# Patient Record
Sex: Female | Born: 1940 | Race: White | Hispanic: No | Marital: Married | Smoking: Never smoker
Health system: Southern US, Community
[De-identification: ages and names within clinical notes are randomized; demographics above are authoritative.]

## PROBLEM LIST (undated history)

## (undated) DIAGNOSIS — I10 Essential (primary) hypertension: Secondary | ICD-10-CM

## (undated) DIAGNOSIS — C50919 Malignant neoplasm of unspecified site of unspecified female breast: Secondary | ICD-10-CM

## (undated) DIAGNOSIS — K219 Gastro-esophageal reflux disease without esophagitis: Secondary | ICD-10-CM

## (undated) DIAGNOSIS — I2119 ST elevation (STEMI) myocardial infarction involving other coronary artery of inferior wall: Principal | ICD-10-CM

## (undated) DIAGNOSIS — K5010 Crohn's disease of large intestine without complications: Secondary | ICD-10-CM

## (undated) DIAGNOSIS — Z8619 Personal history of other infectious and parasitic diseases: Secondary | ICD-10-CM

## (undated) DIAGNOSIS — Z9289 Personal history of other medical treatment: Secondary | ICD-10-CM

## (undated) DIAGNOSIS — K900 Celiac disease: Secondary | ICD-10-CM

## (undated) DIAGNOSIS — Z86718 Personal history of other venous thrombosis and embolism: Secondary | ICD-10-CM

## (undated) HISTORY — DX: Personal history of other medical treatment: Z92.89

## (undated) HISTORY — PX: DILATION AND CURETTAGE OF UTERUS: SHX78

## (undated) HISTORY — DX: Malignant neoplasm of unspecified site of unspecified female breast: C50.919

## (undated) HISTORY — DX: Gastro-esophageal reflux disease without esophagitis: K21.9

## (undated) HISTORY — DX: Personal history of other venous thrombosis and embolism: Z86.718

## (undated) HISTORY — DX: Essential (primary) hypertension: I10

## (undated) HISTORY — DX: Personal history of other infectious and parasitic diseases: Z86.19

---

## 1985-04-04 HISTORY — PX: BREAST SURGERY: SHX581

## 1997-07-22 ENCOUNTER — Ambulatory Visit (HOSPITAL_COMMUNITY): Admission: RE | Admit: 1997-07-22 | Discharge: 1997-07-22 | Payer: Self-pay | Admitting: *Deleted

## 1997-07-24 ENCOUNTER — Ambulatory Visit (HOSPITAL_COMMUNITY): Admission: RE | Admit: 1997-07-24 | Discharge: 1997-07-24 | Payer: Self-pay | Admitting: *Deleted

## 1997-07-24 ENCOUNTER — Encounter: Admission: RE | Admit: 1997-07-24 | Discharge: 1997-10-22 | Payer: Self-pay | Admitting: *Deleted

## 1998-01-21 ENCOUNTER — Ambulatory Visit (HOSPITAL_COMMUNITY): Admission: RE | Admit: 1998-01-21 | Discharge: 1998-01-21 | Payer: Self-pay

## 1998-12-09 ENCOUNTER — Encounter: Payer: Self-pay | Admitting: *Deleted

## 1998-12-09 ENCOUNTER — Ambulatory Visit (HOSPITAL_COMMUNITY): Admission: RE | Admit: 1998-12-09 | Discharge: 1998-12-09 | Payer: Self-pay | Admitting: *Deleted

## 1998-12-17 ENCOUNTER — Observation Stay (HOSPITAL_COMMUNITY): Admission: RE | Admit: 1998-12-17 | Discharge: 1998-12-18 | Payer: Self-pay | Admitting: General Surgery

## 1998-12-17 ENCOUNTER — Encounter (INDEPENDENT_AMBULATORY_CARE_PROVIDER_SITE_OTHER): Payer: Self-pay | Admitting: Specialist

## 1998-12-17 ENCOUNTER — Encounter: Payer: Self-pay | Admitting: General Surgery

## 2000-04-04 LAB — HM PAP SMEAR

## 2003-08-12 ENCOUNTER — Emergency Department (HOSPITAL_COMMUNITY): Admission: EM | Admit: 2003-08-12 | Discharge: 2003-08-12 | Payer: Self-pay | Admitting: Family Medicine

## 2003-09-16 ENCOUNTER — Encounter: Admission: RE | Admit: 2003-09-16 | Discharge: 2003-09-16 | Payer: Self-pay | Admitting: Family Medicine

## 2003-10-07 ENCOUNTER — Encounter: Admission: RE | Admit: 2003-10-07 | Discharge: 2003-10-07 | Payer: Self-pay | Admitting: Sports Medicine

## 2003-10-14 ENCOUNTER — Encounter: Admission: RE | Admit: 2003-10-14 | Discharge: 2003-10-14 | Payer: Self-pay | Admitting: Family Medicine

## 2003-11-12 ENCOUNTER — Encounter: Admission: RE | Admit: 2003-11-12 | Discharge: 2003-11-12 | Payer: Self-pay | Admitting: Sports Medicine

## 2003-12-30 ENCOUNTER — Ambulatory Visit: Payer: Self-pay | Admitting: Sports Medicine

## 2004-01-07 ENCOUNTER — Ambulatory Visit: Payer: Self-pay | Admitting: Sports Medicine

## 2004-01-19 ENCOUNTER — Ambulatory Visit (HOSPITAL_COMMUNITY): Admission: RE | Admit: 2004-01-19 | Discharge: 2004-01-19 | Payer: Self-pay | Admitting: Sports Medicine

## 2004-02-18 ENCOUNTER — Ambulatory Visit: Payer: Self-pay | Admitting: Sports Medicine

## 2004-02-25 ENCOUNTER — Ambulatory Visit: Payer: Self-pay | Admitting: Family Medicine

## 2004-04-02 ENCOUNTER — Ambulatory Visit: Payer: Self-pay | Admitting: Family Medicine

## 2004-04-07 ENCOUNTER — Ambulatory Visit: Payer: Self-pay | Admitting: Family Medicine

## 2004-04-14 ENCOUNTER — Ambulatory Visit: Payer: Self-pay | Admitting: Sports Medicine

## 2004-06-01 ENCOUNTER — Ambulatory Visit: Payer: Self-pay | Admitting: Sports Medicine

## 2004-06-22 ENCOUNTER — Ambulatory Visit (HOSPITAL_COMMUNITY): Admission: RE | Admit: 2004-06-22 | Discharge: 2004-06-22 | Payer: Self-pay | Admitting: Gastroenterology

## 2004-06-22 ENCOUNTER — Encounter (INDEPENDENT_AMBULATORY_CARE_PROVIDER_SITE_OTHER): Payer: Self-pay | Admitting: *Deleted

## 2004-09-14 ENCOUNTER — Ambulatory Visit: Payer: Self-pay | Admitting: Sports Medicine

## 2004-09-20 ENCOUNTER — Ambulatory Visit: Payer: Self-pay | Admitting: Family Medicine

## 2004-11-02 ENCOUNTER — Encounter (INDEPENDENT_AMBULATORY_CARE_PROVIDER_SITE_OTHER): Payer: Self-pay | Admitting: *Deleted

## 2004-12-10 ENCOUNTER — Ambulatory Visit: Payer: Self-pay | Admitting: Family Medicine

## 2004-12-13 ENCOUNTER — Ambulatory Visit (HOSPITAL_COMMUNITY): Admission: RE | Admit: 2004-12-13 | Discharge: 2004-12-13 | Payer: Self-pay | Admitting: Family Medicine

## 2004-12-21 ENCOUNTER — Ambulatory Visit: Payer: Self-pay | Admitting: Sports Medicine

## 2005-01-17 ENCOUNTER — Ambulatory Visit: Payer: Self-pay | Admitting: Family Medicine

## 2005-02-17 ENCOUNTER — Ambulatory Visit (HOSPITAL_COMMUNITY): Admission: RE | Admit: 2005-02-17 | Discharge: 2005-02-17 | Payer: Self-pay | Admitting: Sports Medicine

## 2005-03-10 ENCOUNTER — Ambulatory Visit: Payer: Self-pay | Admitting: Family Medicine

## 2005-03-16 ENCOUNTER — Ambulatory Visit: Payer: Self-pay | Admitting: Family Medicine

## 2005-04-04 HISTORY — PX: CHOLECYSTECTOMY: SHX55

## 2005-04-14 ENCOUNTER — Encounter (INDEPENDENT_AMBULATORY_CARE_PROVIDER_SITE_OTHER): Payer: Self-pay | Admitting: Specialist

## 2005-04-14 ENCOUNTER — Ambulatory Visit (HOSPITAL_COMMUNITY): Admission: RE | Admit: 2005-04-14 | Discharge: 2005-04-14 | Payer: Self-pay | Admitting: Obstetrics and Gynecology

## 2005-06-22 ENCOUNTER — Ambulatory Visit: Payer: Self-pay | Admitting: Family Medicine

## 2005-06-28 ENCOUNTER — Ambulatory Visit: Payer: Self-pay | Admitting: Sports Medicine

## 2005-08-03 ENCOUNTER — Ambulatory Visit (HOSPITAL_COMMUNITY): Admission: RE | Admit: 2005-08-03 | Discharge: 2005-08-03 | Payer: Self-pay | Admitting: Sports Medicine

## 2005-12-19 ENCOUNTER — Ambulatory Visit: Payer: Self-pay | Admitting: Family Medicine

## 2006-06-02 ENCOUNTER — Encounter (INDEPENDENT_AMBULATORY_CARE_PROVIDER_SITE_OTHER): Payer: Self-pay | Admitting: *Deleted

## 2006-09-27 ENCOUNTER — Telehealth: Payer: Self-pay | Admitting: Family Medicine

## 2006-12-26 ENCOUNTER — Ambulatory Visit (HOSPITAL_COMMUNITY): Admission: RE | Admit: 2006-12-26 | Discharge: 2006-12-26 | Payer: Self-pay | Admitting: Internal Medicine

## 2007-01-01 DIAGNOSIS — Z853 Personal history of malignant neoplasm of breast: Secondary | ICD-10-CM

## 2007-01-01 DIAGNOSIS — I10 Essential (primary) hypertension: Secondary | ICD-10-CM | POA: Insufficient documentation

## 2007-12-27 ENCOUNTER — Encounter: Admission: RE | Admit: 2007-12-27 | Discharge: 2007-12-27 | Payer: Self-pay | Admitting: Internal Medicine

## 2009-04-14 ENCOUNTER — Ambulatory Visit: Payer: Self-pay | Admitting: Oncology

## 2009-04-22 ENCOUNTER — Encounter: Admission: RE | Admit: 2009-04-22 | Discharge: 2009-04-22 | Payer: Self-pay | Admitting: Internal Medicine

## 2010-04-04 LAB — HM MAMMOGRAPHY

## 2010-08-20 NOTE — Op Note (Signed)
Kellie Bennett, Kellie Bennett                ACCOUNT NO.:  [id number]   MEDICAL RECORD NO.:  [id number]          PATIENT TYPE:  AMB   LOCATION:  ENDO                         FACILITY:  [facility]   PHYSICIAN:  John C. Madilyn Fireman, M.D.    DATE OF BIRTH:  03-27-1941   DATE OF PROCEDURE:  06/22/2004  DATE OF DISCHARGE:                                 OPERATIVE REPORT   PROCEDURE:  Colonoscopy.   INDICATIONS FOR PROCEDURE:  Intermittent rectal bleeding, a personal history  of breast cancer.   PROCEDURE:  The patient was placed in the left lateral decubitus position  and placed on the pulse monitor with continuous low flow oxygen delivered by  nasal cannula.  She was sedated with 50 mcg IV fentanyl and 6 mg IV Versed.  The Olympus video colonoscope was inserted into the rectum and advanced to  the cecum, confirmed by transillumination at McBurney's point and  visualization of the ileocecal valve and appendiceal orifice.  Prep was  excellent.  The cecum, ascending, transverse, and descending colon appeared  normal with no masses, polyps, diverticula, or other mucosal abnormalities.  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  The  distal 5 cm appeared to be spared with normal-appearing mucosa.  There are  no polyps, pseudopolyps, or ulcers seen.  Biopsies were taken of the  inflamed areas.  The scope was then withdrawn, and the patient returned to  the recovery room in stable condition.  She tolerated the procedure well,  and there were no immediate complications.   IMPRESSION:  Colitis involving the sigmoid and majority of the rectum,  sparing the distal 5 cm.   PLAN:  Await histology, and given that she is already on Azulfindine, will  consider topical therapy with five ASA enemas.      JCH/MEDQ  D:  06/22/2004  T:  06/22/2004   Job:  [id number]   cc:   Melina Fiddler, MD  [address] [place]  [place] [address]  Fax: [phone]

## 2010-08-20 NOTE — Op Note (Signed)
Kellie Bennett, Kellie Bennett                ACCOUNT NO.:  [id number]   MEDICAL RECORD NO.:  [id number]          PATIENT TYPE:  AMB   LOCATION:  SDC                           FACILITY:  [facility]   PHYSICIAN:  Malachi Pro. Ambrose Mantle, M.D. DATE OF BIRTH:  08-30-1940   DATE OF PROCEDURE:  04/14/2005  DATE OF DISCHARGE:                                 OPERATIVE REPORT   PREOPERATIVE DIAGNOSES:  1.  Postmenopausal bleeding.  2.  Probable endometrial polyp.   POSTOPERATIVE DIAGNOSES:  1.  Postmenopausal bleeding.  2.  Large endometrial polyp.   OPERATION:  Dilatation and curettage, hysteroscopy, removal of large  endometrial polyp.   OPERATOR:  Malachi Pro. Ambrose Mantle, M.D.   General anesthesia.   The patient was brought to the operating room and placed under satisfactory  general anesthesia and placed in lithotomy position.  The patient was  incontinent of some stool during the prep.  The vulva and vagina were  prepped with Betadine solution and the area was draped as a sterile field.  Exam revealed the uterus to be anterior and normal size.  The adnexa were  free of masses.  The cervix was drawn into the operative field with a  weighted speculum in the posterior vagina and the uterus sounded to 7+  centimeters anteriorly.  It was dilated to a 27 Pratt dilator.  A  hysteroscope was introduced and you could see an endometrial polyp coming  all the way down to the endocervical canal.  I could not tell exactly where  on the endometrial cavity it arose, but I removed the hysteroscope and began  using polyp forceps, then a uterine dressing forceps and finally a small  sponge forceps to remove the large fragments of this big endometrial polyp.  After I had removed all the tissue I could, I went back with the  hysteroscope and saw the cavity was smooth.  There was a small amount of  tissue left.  I tried to remove additional tissue and then looked again with  the hysteroscope and realized that I had removed all the  tissue that could.  I then did an endocervical curettage and finally an endometrial curettage.  There may be no tissue with either specimen.  Whatever was removed was  preserved and sent to pathology.  The procedure was then terminated.  There  was a sorbitol deficit of 80 mL.  The patient then returned to recovery in  satisfactory condition.      Malachi Pro. Ambrose Mantle, M.D.  Electronically Signed    TFH/MEDQ  D:  04/14/2005  T:  04/14/2005  Job:  [id number]   cc:   William A. Leveda Anna, M.D.  Fax: [phone]

## 2011-08-12 ENCOUNTER — Other Ambulatory Visit: Payer: Self-pay | Admitting: Internal Medicine

## 2011-08-12 DIAGNOSIS — E041 Nontoxic single thyroid nodule: Secondary | ICD-10-CM

## 2011-08-24 ENCOUNTER — Ambulatory Visit
Admission: RE | Admit: 2011-08-24 | Discharge: 2011-08-24 | Disposition: A | Discharge status: Home / Self Care | Payer: Medicare Other | Source: Ambulatory Visit | Attending: Internal Medicine | Admitting: Internal Medicine

## 2011-08-24 DIAGNOSIS — E041 Nontoxic single thyroid nodule: Secondary | ICD-10-CM

## 2011-10-27 ENCOUNTER — Ambulatory Visit (INDEPENDENT_AMBULATORY_CARE_PROVIDER_SITE_OTHER): Payer: Medicare Other | Admitting: Endocrinology

## 2011-10-27 ENCOUNTER — Other Ambulatory Visit (HOSPITAL_COMMUNITY)
Admission: RE | Admit: 2011-10-27 | Discharge: 2011-10-27 | Disposition: A | Discharge status: Home / Self Care | Payer: Medicare Other | Source: Ambulatory Visit | Attending: Endocrinology | Admitting: Endocrinology

## 2011-10-27 ENCOUNTER — Encounter: Payer: Self-pay | Admitting: Endocrinology

## 2011-10-27 VITALS — BP 128/88 | HR 69 | Temp 97.8°F | Ht 61.0 in | Wt 180.0 lb

## 2011-10-27 DIAGNOSIS — E042 Nontoxic multinodular goiter: Secondary | ICD-10-CM | POA: Insufficient documentation

## 2011-10-27 DIAGNOSIS — E039 Hypothyroidism, unspecified: Secondary | ICD-10-CM

## 2011-10-27 DIAGNOSIS — E041 Nontoxic single thyroid nodule: Secondary | ICD-10-CM | POA: Insufficient documentation

## 2011-10-27 MED ORDER — LEVOTHYROXINE SODIUM 50 MCG PO TABS: 50.0000 ug | ORAL_TABLET | Freq: Every day | ORAL | Status: AC

## 2011-10-27 NOTE — Patient Instructions (Addendum)
We'll call you with biopsy results If no cancer is seen, Please come back for a follow-up appointment in 6 months Increase levothyroxine to 50 mcg/day.  i have sent a prescription to your pharmacy. blood tests are being requested for you today, to do in 1 month.  You will receive a letter with results.

## 2011-10-27 NOTE — Progress Notes (Signed)
Subjective:    Patient ID: Kellie Bennett, female    DOB: 02/16/1941, 71 y.o.   MRN: [id number]  HPI Pt says she was rx'ed for hypothyroidism for a few years in the 1970's.  Since then, she says she was told that the thyroid was back to normal, off any supplement.  She was recently restarted on synthroid, after a tsh was elevated. She was also noted on physical exam to have a goiter.   Pt reports few years of moderate hair loss throughout the head, and assoc cold intolerance.   Past Medical History  Diagnosis Date  . Breast cancer   . History of chicken pox   . GERD (gastroesophageal reflux disease)   . Hypertension   . History of blood clots   . History of blood transfusion     Past Surgical History  Procedure Date  . Cholecystectomy 2007  . Breast surgery 1987    Breast Biopsy     History   Social History  . Marital Status: Married    Spouse Name: N/A    Number of Children: 6  . Years of Education: 12   Occupational History  . Retired    Social History Main Topics  . Smoking status: Never Smoker   . Smokeless tobacco: Not on file  . Alcohol Use: Yes  . Drug Use: No  . Sexually Active: Not on file   Other Topics Concern  . Not on file   Social History Narrative   Regular exercise-noCaffeine Use-yes    Current Outpatient Prescriptions on File Prior to Visit  Medication Sig Dispense Refill  . levothyroxine (SYNTHROID, LEVOTHROID) 25 MCG tablet Take 25 mcg by mouth daily.      . metoprolol succinate (TOPROL-XL) 25 MG 24 hr tablet Take 25 mg by mouth 2 (two) times daily.      Marland Kitchen omeprazole (PRILOSEC) 20 MG capsule Take 20 mg by mouth 2 (two) times daily.      Marland Kitchen sulfaSALAzine (AZULFIDINE) 500 MG tablet Take 500 mg by mouth 3 (three) times daily.        Allergies  Allergen Reactions  . Codeine   . Meperidine Hcl     Family History  Problem Relation Age of Onset  . Cancer Maternal Grandmother     Thyroid Cancer  . Stroke Maternal Grandfather   . Hypertension  Other     Parent  no one else in pt's family has thyroid problems.    BP 128/88  Pulse 69  Temp 97.8 F (36.6 C) (Oral)  Ht 5\' 1"  (1.549 m)  Wt 180 lb (81.647 kg)  BMI 34.01 kg/m2  SpO2 96%  Review of Systems Pt reports hoarseness, fatigue, insomnia, headache, and weight gain.  denies polyuria, depression, numbness, menopausal sxs, polyuria, muscle weakness, fever, easy bruising, sob, rash, blurry vision, rhinorrhea, chest pain.     Objective:   Physical Exam VS: see vs page GEN: no distress HEAD: head: no deformity eyes: no periorbital swelling, no proptosis external nose and ears are normal mouth: no lesion seen NECK: thyroid is slightly enlarged, with multinodular surface. CHEST WALL: no deformity LUNGS:  Clear to auscultation CV: reg rate and rhythm, no murmur MUSCULOSKELETAL: muscle bulk and strength are grossly normal.  no obvious joint swelling.  gait is normal and steady EXTEMITIES: no deformity. feet are of normal color and temp.  no edema PULSES: dorsalis pedis intact bilat.  no carotid bruit NEURO:  cn 2-12 grossly intact.   readily moves  all 4's.  sensation is intact to touch on the feet SKIN:  Normal texture and temperature.  No rash or suspicious lesion is visible.   NODES:  None palpable at the neck PSYCH: alert, oriented x3.  Does not appear anxious nor depressed.   outside test results are reviewed: Tsh=6 (on synthroid x 1 month) (i also reviewed [place] result)  thyroid needle bx: consent obtained, signed form on chart local: xylocaine 2% prep: alcohol prep 4 bxs are done with 25 and 27g needles no complications    Assessment & Plan:  Small multinodular goiter, uncertain etiology Hypothyroidism. She needs slightly increased rx Hair loss and other sxs, not thyroid-related

## 2011-10-29 ENCOUNTER — Encounter: Payer: Self-pay | Admitting: Endocrinology

## 2011-10-29 DIAGNOSIS — K219 Gastro-esophageal reflux disease without esophagitis: Secondary | ICD-10-CM | POA: Insufficient documentation

## 2011-10-29 DIAGNOSIS — Z9289 Personal history of other medical treatment: Secondary | ICD-10-CM | POA: Insufficient documentation

## 2011-12-06 ENCOUNTER — Other Ambulatory Visit (INDEPENDENT_AMBULATORY_CARE_PROVIDER_SITE_OTHER): Payer: Medicare Other

## 2011-12-06 DIAGNOSIS — E039 Hypothyroidism, unspecified: Secondary | ICD-10-CM

## 2011-12-07 ENCOUNTER — Encounter: Payer: Self-pay | Admitting: Endocrinology

## 2012-10-30 ENCOUNTER — Other Ambulatory Visit: Payer: Self-pay | Admitting: *Deleted

## 2012-10-30 MED ORDER — LEVOTHYROXINE SODIUM 50 MCG PO TABS: 50.0000 ug | ORAL_TABLET | Freq: Every day | ORAL | Status: DC

## 2012-11-13 ENCOUNTER — Other Ambulatory Visit: Payer: Self-pay | Admitting: Gastroenterology

## 2012-12-14 ENCOUNTER — Other Ambulatory Visit (INDEPENDENT_AMBULATORY_CARE_PROVIDER_SITE_OTHER): Payer: Medicare Other | Admitting: *Deleted

## 2012-12-14 DIAGNOSIS — R55 Syncope and collapse: Secondary | ICD-10-CM

## 2012-12-21 ENCOUNTER — Ambulatory Visit (INDEPENDENT_AMBULATORY_CARE_PROVIDER_SITE_OTHER): Payer: Medicare Other | Admitting: Endocrinology

## 2012-12-21 ENCOUNTER — Encounter: Payer: Self-pay | Admitting: Endocrinology

## 2012-12-21 ENCOUNTER — Other Ambulatory Visit: Payer: Medicare Other | Admitting: *Deleted

## 2012-12-21 ENCOUNTER — Other Ambulatory Visit (INDEPENDENT_AMBULATORY_CARE_PROVIDER_SITE_OTHER): Payer: Medicare Other

## 2012-12-21 VITALS — BP 126/80 | HR 110 | Wt 178.0 lb

## 2012-12-21 DIAGNOSIS — E042 Nontoxic multinodular goiter: Secondary | ICD-10-CM

## 2012-12-21 DIAGNOSIS — Z23 Encounter for immunization: Secondary | ICD-10-CM

## 2012-12-21 MED ORDER — LEVOTHYROXINE SODIUM 50 MCG PO TABS: 50.0000 ug | ORAL_TABLET | Freq: Every day | ORAL | Status: DC

## 2012-12-21 NOTE — Progress Notes (Signed)
Subjective:    Patient ID: Kellie Bennett, female    DOB: [birth year], 72 y.o.   MRN: [id number]  HPI Pt says she was rx'ed for hypothyroidism for a few years in the 1970's.  Since then, she says she was told that the thyroid was back to normal, off any supplement.  She was recently restarted on synthroid, after a tsh was elevated. She was also noted on physical exam to have a goiter.  In 2013, she had an ultrasound showing multinodular goiter.  She had bx, and cytology showed atypical follicular lesion with small lymphocytes.  She ran out of synthroid 1 week ago.  Since then, she has slight hoarseness sensation in the neck, but no assoc pain.  Past Medical History  Diagnosis Date  . History of chicken pox   . History of blood clots   . History of blood transfusion   . Breast cancer   . GERD (gastroesophageal reflux disease)   . Hypertension     Past Surgical History  Procedure Laterality Date  . Cholecystectomy  2007  . Breast surgery  1987    Breast Biopsy     History   Social History  . Marital Status: Married    Spouse Name: N/A    Number of Children: 6  . Years of Education: 12   Occupational History  . Retired    Social History Main Topics  . Smoking status: Never Smoker   . Smokeless tobacco: Not on file  . Alcohol Use: Yes  . Drug Use: No  . Sexual Activity: Not on file   Other Topics Concern  . Not on file   Social History Narrative   Regular exercise-no   Caffeine Use-yes    Current Outpatient Prescriptions on File Prior to Visit  Medication Sig Dispense Refill  . aspirin (BAYER LOW DOSE) 81 MG EC tablet Take 81 mg by mouth daily. Swallow whole.      . hydrOXYzine (VISTARIL) 25 MG capsule Take 25 mg by mouth 2 (two) times daily.      Marland Kitchen loperamide (IMODIUM) 2 MG capsule Take 2 mg by mouth daily.      . metoprolol succinate (TOPROL-XL) 25 MG 24 hr tablet Take 25 mg by mouth 2 (two) times daily.      . naproxen sodium (ALEVE) 220 MG tablet Take 220 mg by  mouth as needed.      Marland Kitchen omeprazole (PRILOSEC) 20 MG capsule Take 20 mg by mouth 2 (two) times daily.      Marland Kitchen sulfaSALAzine (AZULFIDINE) 500 MG tablet Take 500 mg by mouth 3 (three) times daily.       No current facility-administered medications on file prior to visit.   Allergies  Allergen Reactions  . Codeine   . Meperidine Hcl    Family History  Problem Relation Age of Onset  . Cancer Maternal Grandmother     Thyroid Cancer  . Stroke Maternal Grandfather   . Hypertension Other     Parent   BP 126/80  Pulse 110  Wt 178 lb (80.74 kg)  BMI 33.65 kg/m2  SpO2 97%  Review of Systems Denies weight change and neck pain.      Objective:   Physical Exam VITAL SIGNS:  See vs page GENERAL: no distress NECK: thyroid is slightly enlarged, with multinodular surface.     Assessment & Plan:  Multinodular goiter, clinically unchanged Chronic primary hypothyroidism, due for recheck. Noncompliance with synthroid.  In this setting, we'll have  to resume the synthroid, then recheck TSH next month. Hoarseness: uncertain if this is due to being off the synthroid.

## 2012-12-21 NOTE — Patient Instructions (Addendum)
i have sent a prescription to your pharmacy, to refill the thyroid pill. Please redo the blood test in 3-4 weeks, here in this office.   Let's recheck the ultrasound.  you will receive a phone call, about a day and time for an appointment.  If the hoarseness persists despite a normal thyroid level, you should have it checked as a separate problem.   Please return in 1 year.

## 2012-12-26 ENCOUNTER — Ambulatory Visit
Admission: RE | Admit: 2012-12-26 | Discharge: 2012-12-26 | Disposition: A | Discharge status: Home / Self Care | Payer: Medicare Other | Source: Ambulatory Visit | Attending: Endocrinology | Admitting: Endocrinology

## 2012-12-26 DIAGNOSIS — E042 Nontoxic multinodular goiter: Secondary | ICD-10-CM

## 2013-01-18 ENCOUNTER — Other Ambulatory Visit (INDEPENDENT_AMBULATORY_CARE_PROVIDER_SITE_OTHER): Payer: Medicare Other

## 2013-01-18 ENCOUNTER — Other Ambulatory Visit: Payer: Self-pay | Admitting: *Deleted

## 2013-01-18 DIAGNOSIS — E039 Hypothyroidism, unspecified: Secondary | ICD-10-CM

## 2013-01-22 ENCOUNTER — Other Ambulatory Visit: Payer: Self-pay | Admitting: *Deleted

## 2013-01-22 ENCOUNTER — Telehealth: Payer: Self-pay | Admitting: Endocrinology

## 2013-01-22 MED ORDER — LEVOTHYROXINE SODIUM 50 MCG PO TABS: 50.0000 ug | ORAL_TABLET | Freq: Every day | ORAL | Status: DC

## 2013-01-22 NOTE — Telephone Encounter (Signed)
Pt calling back, Kellie Bennett pt, has questions about meds / Sherri

## 2013-09-27 ENCOUNTER — Inpatient Hospital Stay (HOSPITAL_COMMUNITY)
Admission: EM | Admit: 2013-09-27 | Discharge: 2013-09-30 | DRG: 247 | Disposition: A | Discharge status: Home / Self Care | Payer: Medicare Other | Attending: Interventional Cardiology | Admitting: Interventional Cardiology

## 2013-09-27 ENCOUNTER — Encounter (HOSPITAL_COMMUNITY): Payer: Self-pay | Admitting: Emergency Medicine

## 2013-09-27 ENCOUNTER — Encounter (HOSPITAL_COMMUNITY)
Admission: EM | Disposition: A | Discharge status: Home / Self Care | Payer: Self-pay | Source: Home / Self Care | Attending: Interventional Cardiology

## 2013-09-27 ENCOUNTER — Other Ambulatory Visit: Payer: Self-pay

## 2013-09-27 ENCOUNTER — Emergency Department (HOSPITAL_COMMUNITY): Payer: Medicare Other

## 2013-09-27 DIAGNOSIS — I213 ST elevation (STEMI) myocardial infarction of unspecified site: Secondary | ICD-10-CM

## 2013-09-27 DIAGNOSIS — I2582 Chronic total occlusion of coronary artery: Secondary | ICD-10-CM | POA: Diagnosis present

## 2013-09-27 DIAGNOSIS — E785 Hyperlipidemia, unspecified: Secondary | ICD-10-CM | POA: Diagnosis present

## 2013-09-27 DIAGNOSIS — K219 Gastro-esophageal reflux disease without esophagitis: Secondary | ICD-10-CM | POA: Diagnosis present

## 2013-09-27 DIAGNOSIS — I4891 Unspecified atrial fibrillation: Secondary | ICD-10-CM

## 2013-09-27 DIAGNOSIS — Z23 Encounter for immunization: Secondary | ICD-10-CM

## 2013-09-27 DIAGNOSIS — Z888 Allergy status to other drugs, medicaments and biological substances status: Secondary | ICD-10-CM

## 2013-09-27 DIAGNOSIS — I10 Essential (primary) hypertension: Secondary | ICD-10-CM | POA: Diagnosis present

## 2013-09-27 DIAGNOSIS — I2510 Atherosclerotic heart disease of native coronary artery without angina pectoris: Secondary | ICD-10-CM | POA: Diagnosis present

## 2013-09-27 DIAGNOSIS — Z820 Family history of epilepsy and other diseases of the nervous system: Secondary | ICD-10-CM

## 2013-09-27 DIAGNOSIS — Z7982 Long term (current) use of aspirin: Secondary | ICD-10-CM

## 2013-09-27 DIAGNOSIS — E039 Hypothyroidism, unspecified: Secondary | ICD-10-CM | POA: Diagnosis present

## 2013-09-27 DIAGNOSIS — Z885 Allergy status to narcotic agent status: Secondary | ICD-10-CM

## 2013-09-27 DIAGNOSIS — I739 Peripheral vascular disease, unspecified: Secondary | ICD-10-CM | POA: Diagnosis present

## 2013-09-27 DIAGNOSIS — Z808 Family history of malignant neoplasm of other organs or systems: Secondary | ICD-10-CM

## 2013-09-27 DIAGNOSIS — I2119 ST elevation (STEMI) myocardial infarction involving other coronary artery of inferior wall: Principal | ICD-10-CM

## 2013-09-27 DIAGNOSIS — Z853 Personal history of malignant neoplasm of breast: Secondary | ICD-10-CM

## 2013-09-27 DIAGNOSIS — K900 Celiac disease: Secondary | ICD-10-CM | POA: Diagnosis present

## 2013-09-27 DIAGNOSIS — Z823 Family history of stroke: Secondary | ICD-10-CM

## 2013-09-27 DIAGNOSIS — K5010 Crohn's disease of large intestine without complications: Secondary | ICD-10-CM | POA: Diagnosis present

## 2013-09-27 DIAGNOSIS — E871 Hypo-osmolality and hyponatremia: Secondary | ICD-10-CM | POA: Diagnosis present

## 2013-09-27 DIAGNOSIS — I252 Old myocardial infarction: Secondary | ICD-10-CM | POA: Diagnosis present

## 2013-09-27 HISTORY — PX: LEFT HEART CATHETERIZATION WITH CORONARY ANGIOGRAM: SHX5451

## 2013-09-27 HISTORY — DX: ST elevation (STEMI) myocardial infarction involving other coronary artery of inferior wall: I21.19

## 2013-09-27 HISTORY — PX: PERCUTANEOUS CORONARY STENT INTERVENTION (PCI-S): SHX5485

## 2013-09-27 HISTORY — DX: Crohn's disease of large intestine without complications: K50.10

## 2013-09-27 HISTORY — DX: Celiac disease: K90.0

## 2013-09-27 LAB — CBC WITH DIFFERENTIAL/PLATELET
Basophils Absolute: 0 10*3/uL (ref 0.0–0.1)
Basophils Relative: 0 % (ref 0–1)
EOS ABS: 0 10*3/uL (ref 0.0–0.7)
EOS PCT: 0 % (ref 0–5)
HCT: 39 % (ref 36.0–46.0)
Hemoglobin: 12.7 g/dL (ref 12.0–15.0)
LYMPHS ABS: 2.3 10*3/uL (ref 0.7–4.0)
Lymphocytes Relative: 23 % (ref 12–46)
MCH: 27.7 pg (ref 26.0–34.0)
MCHC: 32.6 g/dL (ref 30.0–36.0)
MCV: 85 fL (ref 78.0–100.0)
Monocytes Absolute: 0.7 10*3/uL (ref 0.1–1.0)
Monocytes Relative: 7 % (ref 3–12)
Neutro Abs: 6.9 10*3/uL (ref 1.7–7.7)
Neutrophils Relative %: 70 % (ref 43–77)
PLATELETS: 238 10*3/uL (ref 150–400)
RBC: 4.59 MIL/uL (ref 3.87–5.11)
RDW: 13.9 % (ref 11.5–15.5)
WBC: 9.9 10*3/uL (ref 4.0–10.5)

## 2013-09-27 LAB — I-STAT CHEM 8, ED
BUN: 10 mg/dL (ref 6–23)
Calcium, Ion: 1.07 mmol/L — ABNORMAL LOW (ref 1.13–1.30)
Chloride: 107 mEq/L (ref 96–112)
Creatinine, Ser: 1.3 mg/dL — ABNORMAL HIGH (ref 0.50–1.10)
Glucose, Bld: 152 mg/dL — ABNORMAL HIGH (ref 70–99)
HCT: 44 % (ref 36.0–46.0)
Hemoglobin: 15 g/dL (ref 12.0–15.0)
Potassium: 3.3 mEq/L — ABNORMAL LOW (ref 3.7–5.3)
SODIUM: 131 meq/L — AB (ref 137–147)
TCO2: 20 mmol/L (ref 0–100)

## 2013-09-27 LAB — COMPREHENSIVE METABOLIC PANEL
ALT: 11 U/L (ref 0–35)
AST: 22 U/L (ref 0–37)
Albumin: 4 g/dL (ref 3.5–5.2)
Alkaline Phosphatase: 106 U/L (ref 39–117)
BUN: 11 mg/dL (ref 6–23)
CALCIUM: 9.4 mg/dL (ref 8.4–10.5)
CO2: 20 mEq/L (ref 19–32)
Chloride: 95 mEq/L — ABNORMAL LOW (ref 96–112)
Creatinine, Ser: 1.23 mg/dL — ABNORMAL HIGH (ref 0.50–1.10)
GFR calc non Af Amer: 43 mL/min — ABNORMAL LOW (ref >90)
GFR, EST AFRICAN AMERICAN: 50 mL/min — AB (ref >90)
GLUCOSE: 150 mg/dL — AB (ref 70–99)
Potassium: 3.6 mEq/L — ABNORMAL LOW (ref 3.7–5.3)
Sodium: 134 mEq/L — ABNORMAL LOW (ref 137–147)
Total Bilirubin: 0.2 mg/dL — ABNORMAL LOW (ref 0.3–1.2)
Total Protein: 8.1 g/dL (ref 6.0–8.3)

## 2013-09-27 LAB — I-STAT TROPONIN, ED: TROPONIN I, POC: 0.48 ng/mL — AB (ref 0.00–0.08)

## 2013-09-27 SURGERY — LEFT HEART CATHETERIZATION WITH CORONARY ANGIOGRAM
Anesthesia: LOCAL

## 2013-09-27 MED ORDER — VERAPAMIL HCL 2.5 MG/ML IV SOLN
INTRAVENOUS | Status: AC
  Filled 2013-09-27: qty 2

## 2013-09-27 MED ORDER — METOPROLOL TARTRATE 1 MG/ML IV SOLN
5.0000 mg | Freq: Once | INTRAVENOUS | Status: AC
  Administered 2013-09-27: 5 mg via INTRAVENOUS

## 2013-09-27 MED ORDER — LIDOCAINE HCL (PF) 1 % IJ SOLN
INTRAMUSCULAR | Status: AC
  Filled 2013-09-27: qty 30

## 2013-09-27 MED ORDER — NITROGLYCERIN 0.4 MG SL SUBL
SUBLINGUAL_TABLET | SUBLINGUAL | Status: AC
  Filled 2013-09-27: qty 1

## 2013-09-27 MED ORDER — METOPROLOL TARTRATE 1 MG/ML IV SOLN
INTRAVENOUS | Status: AC
  Filled 2013-09-27: qty 10

## 2013-09-27 MED ORDER — METOPROLOL TARTRATE 1 MG/ML IV SOLN
2.5000 mg | Freq: Once | INTRAVENOUS | Status: AC
  Administered 2013-09-27: 2.5 mg via INTRAVENOUS

## 2013-09-27 MED ORDER — MIDAZOLAM HCL 2 MG/2ML IJ SOLN
INTRAMUSCULAR | Status: AC
  Filled 2013-09-27: qty 2

## 2013-09-27 MED ORDER — BIVALIRUDIN 250 MG IV SOLR
INTRAVENOUS | Status: AC
  Filled 2013-09-27: qty 250

## 2013-09-27 MED ORDER — HEPARIN (PORCINE) IN NACL 2-0.9 UNIT/ML-% IJ SOLN
INTRAMUSCULAR | Status: AC
Start: 2013-09-27 — End: 2013-09-27
  Filled 2013-09-27: qty 1000

## 2013-09-27 MED ORDER — HEPARIN SODIUM (PORCINE) 5000 UNIT/ML IJ SOLN
INTRAMUSCULAR | Status: AC
  Administered 2013-09-27: 4000 [IU]
  Filled 2013-09-27: qty 1

## 2013-09-27 MED ORDER — SODIUM CHLORIDE 0.9 % IV SOLN
Freq: Once | INTRAVENOUS | Status: AC
  Administered 2013-09-27: 22:00:00 via INTRAVENOUS

## 2013-09-27 MED ORDER — NITROGLYCERIN 0.2 MG/ML ON CALL CATH LAB
INTRAVENOUS | Status: AC
  Filled 2013-09-27: qty 1

## 2013-09-27 MED ORDER — NITROGLYCERIN 0.4 MG SL SUBL
0.4000 mg | SUBLINGUAL_TABLET | SUBLINGUAL | Status: DC | PRN
  Administered 2013-09-27: 0.4 mg via SUBLINGUAL

## 2013-09-27 MED ORDER — METOPROLOL TARTRATE 1 MG/ML IV SOLN
INTRAVENOUS | Status: AC
  Filled 2013-09-27: qty 5

## 2013-09-27 MED ORDER — FENTANYL CITRATE 0.05 MG/ML IJ SOLN
INTRAMUSCULAR | Status: AC
  Filled 2013-09-27: qty 2

## 2013-09-27 MED ORDER — ASPIRIN 81 MG PO CHEW
162.0000 mg | CHEWABLE_TABLET | Freq: Once | ORAL | Status: AC
  Administered 2013-09-27: 162 mg via ORAL
  Filled 2013-09-27: qty 2

## 2013-09-27 NOTE — ED Notes (Signed)
Dr Darl Householder given a copy of troponin results .[place]

## 2013-09-27 NOTE — ED Notes (Signed)
Pt brought from triage to Trauma B.  Dr. Darl Householder at bedside and STEMI activated.

## 2013-09-27 NOTE — ED Notes (Signed)
Pt transported to cath lab by RN, RRT, and EMT.  Denies pain.

## 2013-09-27 NOTE — ED Notes (Signed)
All of pt's belongings (shoes, shirt, pants, underwear, socks) were given to pt's husband by Mammie Lorenzo, RN.  Husband to cath lab waiting area via wheelchair by chaplain.

## 2013-09-27 NOTE — ED Notes (Addendum)
2nd IV access attempted by Mammie Lorenzo, RN- unsuccessful.

## 2013-09-27 NOTE — ED Notes (Addendum)
PCXR completed.  Dr. Radford Pax at bedside.  RRT at bedside.

## 2013-09-27 NOTE — H&P (Signed)
Admit date: 09/27/2013 Referring Physician:  Dr. Darl Householder Primary Cardiologist:  None Chief complaint/reason for admission:Chest pain  HPI: This is a 73yo WF with a history of Chron's disease and celiac spru, HTN hypothyroidism and GERD who presented to the ER with complaints of chest pain.  She was in her USOH until this evening around 5pm when she developed "indigestion symptoms" that felt like chest pressure.  There was no radiation of the discomfort and no associated symptoms of SOB, nausea or diaphoresis.  It persisted and around 8pm and she took a Xanax which did not improve her symptoms and she presented to the ER.  In there ER she was noted to be in rapid atrial fibrillation with ST elevation in the inferolateral leads.  She was given Lopressor 5mg  IV.  Currently her chest discomfort is 1-2/10.      PMH:    Past Medical History  Diagnosis Date  . History of chicken pox   . History of blood clots   . History of blood transfusion   . Breast cancer   . GERD (gastroesophageal reflux disease)   . Hypertension   . Crohn's colitis   . Celiac disease     PSH:    Past Surgical History  Procedure Laterality Date  . Cholecystectomy  2007  . Breast surgery  1987    Breast Biopsy   . Dilation and curettage of uterus      ALLERGIES:   Codeine and Meperidine hcl  Prior to Admit Meds:   (Not in a hospital admission) Family HX:    Family History  Problem Relation Age of Onset  . Cancer Maternal Grandmother     Thyroid Cancer  . Stroke Maternal Grandfather   . Hypertension Other     Parent  . Cancer Mother   . Alzheimer's disease Father    Social HX:    History   Social History  . Marital Status: Married    Spouse Name: N/A    Number of Children: 6  . Years of Education: 12   Occupational History  . Retired    Social History Main Topics  . Smoking status: Never Smoker   . Smokeless tobacco: Not on file  . Alcohol Use: Yes     Comment: occasional  . Drug Use: No  .  Sexual Activity: Not on file   Other Topics Concern  . Not on file   Social History Narrative   Regular exercise-no   Caffeine Use-yes     ROS:  All 11 ROS were addressed and are negative except what is stated in the HPI  PHYSICAL EXAM Filed Vitals:   09/27/13 2230  BP:   Pulse:   Temp: 98.6 F (37 C)  Resp:    General: Well developed, well nourished, in no acute distress Head: Eyes PERRLA, No xanthomas.   Normal cephalic and atramatic  Lungs:   Clear bilaterally to auscultation and percussion. Heart:   Irregularly irregular S1 S2 Pulses are 2+ & equal.            No carotid bruit. No JVD.  No abdominal bruits. No femoral bruits. Abdomen: Bowel sounds are positive, abdomen soft and non-tender without masses  Extremities:   No clubbing, cyanosis or edema.  DP +1 Neuro: Alert and oriented X 3. Psych:  Good affect, responds appropriately   Labs:   Lab Results  Component Value Date   WBC 9.9 09/27/2013   HGB 15.0 09/27/2013   HCT 44.0 09/27/2013  MCV 85.0 09/27/2013   PLT 238 09/27/2013     Recent Labs Lab 09/27/13 2222  NA 131*  K 3.3*  CL 107  BUN 10  CREATININE 1.30*  GLUCOSE 152*   No results found for this basename: CKTOTAL,  CKMB,  CKMBINDEX,  TROPONINI   No results found for this basename: PTT   No results found for this basename: INR,  PROTIME     No results found for this basename: CHOL   No results found for this basename: HDL   No results found for this basename: LDLCALC   No results found for this basename: TRIG   No results found for this basename: CHOLHDL   No results found for this basename: LDLDIRECT      Radiology:  No results found.  EKG:  Atrial fibrillation with RVR, ST elevation in III, aVF and V6 with reciprocal ST deprssion in V2 and V3  ASSESSMENT:  1.  Acute STEMI inferolaterally 2.  Atrial fibrillation with RVR 3.  HTN - BP markedly elevated 4.  Hypothyroidism  PLAN:   1.  Admit to CCU 2.  Emergent cath 3.   ASA 4.  IV Heparin per pharmacy 5.  Lopressor 5mg  IV given already in ER and will give another 2.5mg  IV to try to get rate controlled then Lopressor 25mg  BID 6.  2D echo in am to assess LVF 7.  Lipitor 80mg  daily   Sueanne Margarita, MD  09/27/2013  10:33 PM

## 2013-09-27 NOTE — ED Notes (Signed)
Pt presents with sudden onset of non radiating central chest pain starting at 2100 this pm, pt states she thought it was indigestion and has been taking OTC medication for heart burn with no relief. Pt denies any other symptoms. Pt does report a dry cough for several days. EKG completed in triage, Melissa RN made aware of need for a room, Dr. Dominic Pea activated a Code Stemi after seeing EKG.

## 2013-09-27 NOTE — ED Notes (Signed)
Pt denies any pain at this time.

## 2013-09-27 NOTE — Progress Notes (Signed)
Chaplain responded to Code STEMI page. Met pt's husband outside Trauma B. Chaplain waited outside Trauma B until pt was ready to go cath lab. Chaplain helped pt's husband into [facility] and wheeled him behind pt as she was taken to cath lab. Chaplain stayed with pt's husband in cath lab waiting area until friends arrived.

## 2013-09-27 NOTE — ED Provider Notes (Signed)
CSN: [id number]     Arrival date & time 09/27/13  2154 History   First MD Initiated Contact with Patient 09/27/13 2205     Chief Complaint  Patient presents with  . Chest Pain     (Consider location/radiation/quality/duration/timing/severity/associated sxs/prior Treatment) The history is provided by the patient.  Kellie Bennett is a 73 y.o. female hx of breast ca, cholecystectomy here with chest pain. Chest pressure, nauseated feeling since 5 pm today. Felt like indigestion. She took ASA 81 mg x 2 at home and still didn't feel better. Worsening chest pain since 9 pm today. Denies hx of CAD or arrhythmias.    Past Medical History  Diagnosis Date  . History of chicken pox   . History of blood clots   . History of blood transfusion   . Breast cancer   . GERD (gastroesophageal reflux disease)   . Hypertension    Past Surgical History  Procedure Laterality Date  . Cholecystectomy  2007  . Breast surgery  1987    Breast Biopsy    Family History  Problem Relation Age of Onset  . Cancer Maternal Grandmother     Thyroid Cancer  . Stroke Maternal Grandfather   . Hypertension Other     Parent   History  Substance Use Topics  . Smoking status: Never Smoker   . Smokeless tobacco: Not on file  . Alcohol Use: Yes   OB History   Grav Para Term Preterm Abortions TAB SAB Ect Mult Living                 Review of Systems  Cardiovascular: Positive for chest pain and palpitations.  All other systems reviewed and are negative.     Allergies  Codeine and Meperidine hcl  Home Medications   Prior to Admission medications   Medication Sig Start Date End Date Taking? Authorizing Provider  aspirin (BAYER LOW DOSE) 81 MG EC tablet Take 81 mg by mouth daily. Swallow whole.    Historical Provider, MD  hydrOXYzine (VISTARIL) 25 MG capsule Take 25 mg by mouth 2 (two) times daily.    Historical Provider, MD  levothyroxine (SYNTHROID) 50 MCG tablet Take 1 tablet (50 mcg total) by mouth  daily before breakfast. 01/22/13   Renato Shin, MD  loperamide (IMODIUM) 2 MG capsule Take 2 mg by mouth daily.    Historical Provider, MD  metoprolol succinate (TOPROL-XL) 25 MG 24 hr tablet Take 25 mg by mouth 2 (two) times daily.    Historical Provider, MD  naproxen sodium (ALEVE) 220 MG tablet Take 220 mg by mouth as needed.    Historical Provider, MD  omeprazole (PRILOSEC) 20 MG capsule Take 20 mg by mouth 2 (two) times daily.    Historical Provider, MD  sulfaSALAzine (AZULFIDINE) 500 MG tablet Take 500 mg by mouth 3 (three) times daily.    Historical Provider, MD   BP 113/83  Pulse 95  Temp(Src) 97.7 F (36.5 C) (Oral)  Resp 22  Ht 5\' 1"  (1.549 m)  Wt 166 lb (75.297 kg)  BMI 31.38 kg/m2  SpO2 100% Physical Exam  Nursing note and vitals reviewed. Constitutional: She is oriented to person, place, and time.  Uncomfortable   HENT:  Head: Normocephalic.  Mouth/Throat: Oropharynx is clear and moist.  Eyes: Conjunctivae are normal. Pupils are equal, round, and reactive to light.  Neck: Normal range of motion. Neck supple.  Cardiovascular:  Tachycardic, irregular   Pulmonary/Chest: Effort normal and breath sounds normal. No  respiratory distress. She has no wheezes. She has no rales.  Abdominal: Soft. Bowel sounds are normal. She exhibits no distension. There is no tenderness. There is no rebound.  Musculoskeletal: Normal range of motion. She exhibits no edema and no tenderness.  Neurological: She is alert and oriented to person, place, and time. No cranial nerve deficit. Coordination normal.  Skin: Skin is warm and dry.  Psychiatric: She has a normal mood and affect. Her behavior is normal. Judgment and thought content normal.    ED Course  Procedures (including critical care time)  CRITICAL CARE Performed by: Darl Householder, DAVID   Total critical care time: 30 min   Critical care time was exclusive of separately billable procedures and treating other patients.  Critical care was  necessary to treat or prevent imminent or life-threatening deterioration.  Critical care was time spent personally by me on the following activities: development of treatment plan with patient and/or surrogate as well as nursing, discussions with consultants, evaluation of patient's response to treatment, examination of patient, obtaining history from patient or surrogate, ordering and performing treatments and interventions, ordering and review of laboratory studies, ordering and review of radiographic studies, pulse oximetry and re-evaluation of patient's condition.   Labs Review Labs Reviewed  I-STAT CHEM 8, ED - Abnormal; Notable for the following:    Sodium 131 (*)    Potassium 3.3 (*)    Creatinine, Ser 1.30 (*)    Glucose, Bld 152 (*)    Calcium, Ion 1.07 (*)    All other components within normal limits  CBC WITH DIFFERENTIAL  COMPREHENSIVE METABOLIC PANEL  I-STAT TROPOININ, ED    Imaging Review No results found.   EKG Interpretation   Date/Time:  Friday September 27 2013 22:14:01 EDT Ventricular Rate:  157 PR Interval:    QRS Duration: 84 QT Interval:  309 QTC Calculation: 499 R Axis:   46 Text Interpretation:  Atrial fibrillation with rapid V-rate  Inferoposterior infarct, acute (RCA) Lateral leads are also involved  Probable RV involvement, suggest recording right precordial leads STEMI  inferiorly with reciprocal changes V2, V3 Confirmed by YAO  MD, DAVID  ([id number]) on 09/27/2013 10:21:49 PM      MDM   Final diagnoses:  None    Kellie Bennett is a 73 y.o. female here with chest pain, palpitations. EKG showed new onset afib with STEMI inferiorly. STEMI activated. Dr. Irish Lack recommended lopressor for afib. Given heparin, asa, nitro. Cardiology will cath.    Wandra Arthurs, MD 09/27/13 2230

## 2013-09-28 ENCOUNTER — Encounter (HOSPITAL_COMMUNITY): Payer: Self-pay | Admitting: Interventional Cardiology

## 2013-09-28 DIAGNOSIS — I2510 Atherosclerotic heart disease of native coronary artery without angina pectoris: Secondary | ICD-10-CM

## 2013-09-28 DIAGNOSIS — I219 Acute myocardial infarction, unspecified: Secondary | ICD-10-CM

## 2013-09-28 LAB — CBC WITH DIFFERENTIAL/PLATELET
BASOS ABS: 0 10*3/uL (ref 0.0–0.1)
BASOS PCT: 0 % (ref 0–1)
Eosinophils Absolute: 0 10*3/uL (ref 0.0–0.7)
Eosinophils Relative: 0 % (ref 0–5)
HCT: 36.8 % (ref 36.0–46.0)
Hemoglobin: 11.9 g/dL — ABNORMAL LOW (ref 12.0–15.0)
LYMPHS PCT: 17 % (ref 12–46)
Lymphs Abs: 1.6 10*3/uL (ref 0.7–4.0)
MCH: 28.1 pg (ref 26.0–34.0)
MCHC: 32.3 g/dL (ref 30.0–36.0)
MCV: 86.8 fL (ref 78.0–100.0)
MONO ABS: 0.6 10*3/uL (ref 0.1–1.0)
Monocytes Relative: 6 % (ref 3–12)
NEUTROS ABS: 7.2 10*3/uL (ref 1.7–7.7)
Neutrophils Relative %: 77 % (ref 43–77)
PLATELETS: 209 10*3/uL (ref 150–400)
RBC: 4.24 MIL/uL (ref 3.87–5.11)
RDW: 14.1 % (ref 11.5–15.5)
WBC: 9.3 10*3/uL (ref 4.0–10.5)

## 2013-09-28 LAB — COMPREHENSIVE METABOLIC PANEL
ALK PHOS: 98 U/L (ref 39–117)
ALT: 16 U/L (ref 0–35)
AST: 84 U/L — AB (ref 0–37)
Albumin: 3.5 g/dL (ref 3.5–5.2)
BILIRUBIN TOTAL: 0.3 mg/dL (ref 0.3–1.2)
BUN: 10 mg/dL (ref 6–23)
CO2: 20 mEq/L (ref 19–32)
Calcium: 8.6 mg/dL (ref 8.4–10.5)
Chloride: 96 mEq/L (ref 96–112)
Creatinine, Ser: 1.11 mg/dL — ABNORMAL HIGH (ref 0.50–1.10)
GFR calc Af Amer: 56 mL/min — ABNORMAL LOW (ref >90)
GFR calc non Af Amer: 48 mL/min — ABNORMAL LOW (ref >90)
Glucose, Bld: 124 mg/dL — ABNORMAL HIGH (ref 70–99)
Potassium: 4.3 mEq/L (ref 3.7–5.3)
Sodium: 132 mEq/L — ABNORMAL LOW (ref 137–147)
TOTAL PROTEIN: 7.1 g/dL (ref 6.0–8.3)

## 2013-09-28 LAB — LIPID PANEL
Cholesterol: 220 mg/dL — ABNORMAL HIGH (ref 0–200)
HDL: 28 mg/dL — AB (ref >39)
LDL CALC: 139 mg/dL — AB (ref 0–99)
Total CHOL/HDL Ratio: 7.9 RATIO
Triglycerides: 263 mg/dL — ABNORMAL HIGH (ref <150)
VLDL: 53 mg/dL — ABNORMAL HIGH (ref 0–40)

## 2013-09-28 LAB — PROTIME-INR
INR: 2.39 — ABNORMAL HIGH (ref 0.00–1.49)
Prothrombin Time: 26.1 seconds — ABNORMAL HIGH (ref 11.6–15.2)

## 2013-09-28 LAB — HEMOGLOBIN A1C
HEMOGLOBIN A1C: 6.2 % — AB (ref <5.7)
Mean Plasma Glucose: 131 mg/dL — ABNORMAL HIGH (ref <117)

## 2013-09-28 LAB — TROPONIN I: Troponin I: >20 ng/mL (ref <0.30)

## 2013-09-28 LAB — TSH: TSH: 4.43 u[IU]/mL (ref 0.350–4.500)

## 2013-09-28 LAB — APTT: APTT: 191 s — AB (ref 24–37)

## 2013-09-28 LAB — MAGNESIUM: MAGNESIUM: 1.7 mg/dL (ref 1.5–2.5)

## 2013-09-28 LAB — MRSA PCR SCREENING: MRSA by PCR: NEGATIVE

## 2013-09-28 MED ORDER — PANTOPRAZOLE SODIUM 40 MG PO TBEC
40.0000 mg | DELAYED_RELEASE_TABLET | Freq: Every day | ORAL | Status: DC
  Administered 2013-09-28 – 2013-09-30 (×3): 40 mg via ORAL
  Filled 2013-09-28 (×3): qty 1

## 2013-09-28 MED ORDER — SULFASALAZINE 500 MG PO TABS
500.0000 mg | ORAL_TABLET | Freq: Three times a day (TID) | ORAL | Status: DC
Start: 2013-09-28 — End: 2013-09-30
  Administered 2013-09-28 – 2013-09-30 (×10): 500 mg via ORAL
  Filled 2013-09-28 (×13): qty 1

## 2013-09-28 MED ORDER — ENOXAPARIN SODIUM 40 MG/0.4ML ~~LOC~~ SOLN
40.0000 mg | SUBCUTANEOUS | Status: DC
  Administered 2013-09-28 – 2013-09-30 (×3): 40 mg via SUBCUTANEOUS
  Filled 2013-09-28 (×3): qty 0.4

## 2013-09-28 MED ORDER — PNEUMOCOCCAL VAC POLYVALENT 25 MCG/0.5ML IJ INJ
0.5000 mL | INJECTION | INTRAMUSCULAR | Status: AC
  Administered 2013-09-29: 0.5 mL via INTRAMUSCULAR
  Filled 2013-09-28: qty 0.5

## 2013-09-28 MED ORDER — BIVALIRUDIN 250 MG IV SOLR
INTRAVENOUS | Status: AC
  Filled 2013-09-28: qty 250

## 2013-09-28 MED ORDER — ONDANSETRON HCL 4 MG/2ML IJ SOLN: 4.0000 mg | Freq: Four times a day (QID) | INTRAMUSCULAR | Status: DC | PRN

## 2013-09-28 MED ORDER — SODIUM CHLORIDE 0.9 % IV SOLN: 0.2500 mg/kg/h | INTRAVENOUS | Status: AC

## 2013-09-28 MED ORDER — ASPIRIN EC 81 MG PO TBEC
81.0000 mg | DELAYED_RELEASE_TABLET | Freq: Every day | ORAL | Status: DC
  Administered 2013-09-28 – 2013-09-30 (×3): 81 mg via ORAL
  Filled 2013-09-28 (×3): qty 1

## 2013-09-28 MED ORDER — ATORVASTATIN CALCIUM 80 MG PO TABS
80.0000 mg | ORAL_TABLET | Freq: Every day | ORAL | Status: DC
  Administered 2013-09-28 – 2013-09-29 (×2): 80 mg via ORAL
  Filled 2013-09-28 (×3): qty 1

## 2013-09-28 MED ORDER — ACETAMINOPHEN 325 MG PO TABS: 650.0000 mg | ORAL_TABLET | ORAL | Status: DC | PRN

## 2013-09-28 MED ORDER — ASPIRIN EC 81 MG PO TBEC: 81.0000 mg | DELAYED_RELEASE_TABLET | Freq: Every day | ORAL | Status: DC

## 2013-09-28 MED ORDER — NITROGLYCERIN 0.4 MG SL SUBL: 0.4000 mg | SUBLINGUAL_TABLET | SUBLINGUAL | Status: DC | PRN

## 2013-09-28 MED ORDER — LEVOTHYROXINE SODIUM 50 MCG PO TABS
50.0000 ug | ORAL_TABLET | Freq: Every day | ORAL | Status: DC
  Administered 2013-09-28 – 2013-09-30 (×3): 50 ug via ORAL
  Filled 2013-09-28 (×4): qty 1

## 2013-09-28 MED ORDER — TICAGRELOR 90 MG PO TABS
ORAL_TABLET | ORAL | Status: AC
  Filled 2013-09-28: qty 1

## 2013-09-28 MED ORDER — LOPERAMIDE HCL 2 MG PO CAPS
4.0000 mg | ORAL_CAPSULE | Freq: Every day | ORAL | Status: DC
  Administered 2013-09-28 – 2013-09-30 (×3): 4 mg via ORAL
  Filled 2013-09-28 (×4): qty 2

## 2013-09-28 MED ORDER — METOPROLOL SUCCINATE ER 25 MG PO TB24
25.0000 mg | ORAL_TABLET | Freq: Two times a day (BID) | ORAL | Status: DC
  Administered 2013-09-28 – 2013-09-30 (×6): 25 mg via ORAL
  Filled 2013-09-28 (×7): qty 1

## 2013-09-28 MED ORDER — TICAGRELOR 90 MG PO TABS
ORAL_TABLET | ORAL | Status: AC
  Administered 2013-09-28: 90 mg via ORAL
  Filled 2013-09-28: qty 1

## 2013-09-28 MED ORDER — MAGNESIUM SULFATE 40 MG/ML IJ SOLN
2.0000 g | Freq: Once | INTRAMUSCULAR | Status: AC
  Administered 2013-09-28: 2 g via INTRAVENOUS
  Filled 2013-09-28: qty 50

## 2013-09-28 MED ORDER — TICAGRELOR 90 MG PO TABS
90.0000 mg | ORAL_TABLET | Freq: Two times a day (BID) | ORAL | Status: DC
  Administered 2013-09-28 – 2013-09-30 (×5): 90 mg via ORAL
  Filled 2013-09-28 (×6): qty 1

## 2013-09-28 MED ORDER — HEPARIN (PORCINE) IN NACL 100-0.45 UNIT/ML-% IJ SOLN
1000.0000 [IU]/h | INTRAMUSCULAR | Status: DC
  Administered 2013-09-28: 1000 [IU]/h via INTRAVENOUS
  Filled 2013-09-28 (×2): qty 250

## 2013-09-28 MED ORDER — SODIUM CHLORIDE 0.9 % IV SOLN: 1.0000 mL/kg/h | INTRAVENOUS | Status: AC

## 2013-09-28 MED ORDER — ASPIRIN 81 MG PO CHEW: 81.0000 mg | CHEWABLE_TABLET | Freq: Every day | ORAL | Status: DC

## 2013-09-28 NOTE — Progress Notes (Signed)
Subjective:   Underwent PCI to ostial RCA and RPL with DES this am for acute MI. Also had new AF on presentation.   Back in NSR. Ambulating unit. No CP. Eager to go home   Intake/Output Summary (Last 24 hours) at 09/28/13 1141 Last data filed at 09/28/13 0600  Gross per 24 hour  Intake  395.5 ml  Output   1100 ml  Net -704.5 ml    Current meds: . aspirin EC  81 mg Oral Daily  . atorvastatin  80 mg Oral q1800  . levothyroxine  50 mcg Oral QAC breakfast  . loperamide  4 mg Oral Q breakfast  . metoprolol succinate  25 mg Oral BID  . pantoprazole  40 mg Oral Daily  . [START ON 09/29/2013] pneumococcal 23 valent vaccine  0.5 mL Intramuscular Tomorrow-1000  . sulfaSALAzine  500 mg Oral TID AC & HS  . ticagrelor  90 mg Oral BID   Infusions: . heparin 1,000 Units/hr (09/28/13 1125)     Objective:  Blood pressure 158/67, pulse 53, temperature 97.9 F (36.6 C), temperature source Oral, resp. rate 18, height 5\' 1"  (1.549 m), weight 75.297 kg (166 lb), SpO2 99.00%. Weight change:   Physical Exam: General:  Well appearing. No resp difficulty HEENT: normal Neck: supple. JVP flat . Carotids 2+ bilat; no bruits. No lymphadenopathy or thryomegaly appreciated. Cor: PMI nondisplaced. Regular rate & rhythm. No rubs, gallops or murmurs. Lungs: clear Abdomen: soft, nontender, nondistended. No hepatosplenomegaly. No bruits or masses. Good bowel sounds. Extremities: no cyanosis, clubbing, rash, edema Neuro: alert & orientedx3, cranial nerves grossly intact. moves all 4 extremities w/o difficulty. Affect pleasant  Telemetry: SR  Lab Results: Basic Metabolic Panel:  Recent Labs Lab 09/27/13 2210 09/27/13 2222 09/28/13 0150  NA 134* 131* 132*  K 3.6* 3.3* 4.3  CL 95* 107 96  CO2 20  --  20  GLUCOSE 150* 152* 124*  BUN 11 10 10   CREATININE 1.23* 1.30* 1.11*  CALCIUM 9.4  --  8.6  MG  --   --  1.7   Liver Function Tests:  Recent Labs Lab 09/27/13 2210 09/28/13 0150  AST  22 84*  ALT 11 16  ALKPHOS 106 98  BILITOT 0.2* 0.3  PROT 8.1 7.1  ALBUMIN 4.0 3.5   No results found for this basename: LIPASE, AMYLASE,  in the last 168 hours No results found for this basename: AMMONIA,  in the last 168 hours CBC:  Recent Labs Lab 09/27/13 2210 09/27/13 2222 09/28/13 0150  WBC 9.9  --  9.3  NEUTROABS 6.9  --  7.2  HGB 12.7 15.0 11.9*  HCT 39.0 44.0 36.8  MCV 85.0  --  86.8  PLT 238  --  209   Cardiac Enzymes:  Recent Labs Lab 09/28/13 0150 09/28/13 0740  TROPONINI >20.00* >20.00*   BNP: No components found with this basename: POCBNP,  CBG: No results found for this basename: GLUCAP,  in the last 168 hours Microbiology: No results found for this basename: cult   No results found for this basename: CULT, SDES,  in the last 168 hours  Imaging: Dg Chest Port 1 View  09/27/2013   CLINICAL DATA:  Chest pain.  EXAM: PORTABLE CHEST - 1 VIEW  COMPARISON:  None.  FINDINGS: The heart size and mediastinal contours are within normal limits. Both lungs are clear. No pneumothorax or pleural effusion is noted. Left axillary surgical clips are noted. The visualized skeletal structures are unremarkable.  IMPRESSION: No acute cardiopulmonary abnormality seen.   Electronically Signed   By: Sabino Dick M.D.   On: 09/27/2013 22:33     ASSESSMENT:  1. Inferior STEMI/CAD     --S/p DES to ostial RCA and RPL (6/27)     --trop > 20 2. AF, new    --CHADS2 = 1 3. Crohn's disease 4. HTN  5. HL 6. Hypomagnesemia/hyponatremia  PLAN/DISCUSSION:  Doing very well post MI. Back in NSR. Suspect AF due to MI. (CHADS2 = 1) Can stop heparin. Will not need coumadin.  Continue post-MI care. Brilinta, asa, b-blocker, cardiac rehab.  Supp Mag.   Can go to SDU. Home Monday am.    LOS: 1 day    Glori Bickers, MD 09/28/2013, 11:41 AM

## 2013-09-28 NOTE — Progress Notes (Signed)
ANTICOAGULATION CONSULT NOTE - Initial Consult  Pharmacy Consult for Heparin  Indication: atrial fibrillation, s/p cath with stent  Allergies  Allergen Reactions  . Codeine Nausea And Vomiting  . Meperidine Hcl Nausea And Vomiting   Patient Measurements: Height: 5\' 1"  (154.9 cm) Weight: 166 lb (75.297 kg) IBW/kg (Calculated) : 47.8 Heparin Dosing Weight: ~64 kg  Vital Signs: Temp: 98.6 F (37 C) (06/26 2230) Temp src: Oral (06/26 2230) BP: 143/69 mmHg (06/26 2230) Pulse Rate: 103 (06/26 2254)  Labs:  Recent Labs  09/27/13 2210 09/27/13 2222  HGB 12.7 15.0  HCT 39.0 44.0  PLT 238  --   CREATININE 1.23* 1.30*    Estimated Creatinine Clearance: 36.3 ml/min (by C-G formula based on Cr of 1.3).   Medical History: Past Medical History  Diagnosis Date  . History of chicken pox   . History of blood clots   . History of blood transfusion   . Breast cancer   . GERD (gastroesophageal reflux disease)   . Hypertension   . Crohn's colitis   . Celiac disease   . Acute myocardial infarction of inferoposterior wall, initial episode of care     Assessment: 73 y/o F with afib who is also s/p cath tonight with DES, will start heparin for the afib around 8 hours after sheath removal which was documented around 0010 this AM. CBC good, Scr 1.3, other labs as above.   Goal of Therapy:  Heparin level 0.3-0.7 units/ml Monitor platelets by anticoagulation protocol: Yes   Plan:  -Start heparin at 1000 units/hr at 0800 -1600 HL -Daily CBC/HL -Monitor for bleeding -F/U start of oral anti-coagulation   Narda Bonds 09/28/2013,1:01 AM

## 2013-09-28 NOTE — CV Procedure (Addendum)
PROCEDURE:  Left heart catheterization with selective coronary angiography, left ventriculogram.  PCI RCA; PCI posterolateral artery  INDICATIONS:  Inferoposterior MI  The risks, benefits, and details of the procedure were explained to the patient.  The patient verbalized understanding and wanted to proceed.  Informed written consent was obtained.  PROCEDURE TECHNIQUE:  After Xylocaine anesthesia a 13F sheath was placed in the right femoral artery with a single anterior needle wall stick.   Left coronary angiography was done using a Judkins L4 guide catheter.  Right coronary angiography was done using a Judkins R4 guide catheter.  Left ventriculography was done using a pigtail catheter. An Angio-Seal was used for hemostasis.   CONTRAST:  Total of 255 cc.  COMPLICATIONS:  None.    HEMODYNAMICS:  Aortic pressure was 129/73; LV pressure was 129/7; LVEDP 17.  There was no gradient between the left ventricle and aorta.    ANGIOGRAPHIC DATA:   The left main coronary artery is widely patent, with mild disease.  The left anterior descending artery is a medium size vessel. In the mid vessel, there is moderate, diffuse disease. The distal LAD is large. The apical LAD is quite small just barely wraps around the apex.  There is a medium size diagonal vessel which is long branches across the lateral wall. In the midportion of this vessel, there is an 80% stenosis.  The left circumflex artery is a medium size vessel. There is mild to moderate disease in the midportion. There are several small obtuse marginals which are patent. There is a large obtuse marginal branch which branches across the lateral wall..  The right coronary artery is a large dominant vessel.  There was significant pressure damping with engaging a 6 French catheter with the ostial right. Proximal to mid vessel has mild, diffuse disease. The posterior descending artery is a large vessel which reaches the apex. The posterior lateral artery  is a large vessel and this is occluded.  After intervention, it is noted at the posterior lateral artery is a long vessel branching across the lateral wall. There is a branch which goes superiorly and this is also occluded.  LEFT VENTRICULOGRAM:  Left ventricular angiogram was done in the 30 RAO projection and revealed normal left ventricular wall motion and systolic function with an estimated ejection fraction of 55 %.  LVEDP was 17 mmHg.  PCI NARRATIVE:  IV Angiomax was used for anticoagulation. An ACT was used to check that the Angiomax was therapeutic. A JR 4 catheter was used to engage the RCA. Due to pressure damping, this was changed out to a JR 4 with side holes. A pro-water wire was placed across the ostial stenosis and advanced to the posterior lateral artery. This would not cross the occlusion in the posterior lateral artery. A Fielder XT wire was then used and this successfully did cross the occlusion in the posterior lateral artery. A 2.5 x 12 balloon was used to predilate the occlusion. After predilatation, there appeared to be a long area of disease. A 2.25 x 30 Resolute drug-eluting stent was deployed at 12 atmospheres. The entire stented segment was post dilated with a 2.5 x 20 noncompliant balloon. Attention was then turned to the ostial RCA.  The ostial RCA was predilated with a 3.0 x 12 every 4 balloon. A 4.0 x 15 resolute drug-eluting stent was deployed at 14 atmospheres. The stent balloon was then pulled back and used to post dilate the stent as well as flare the ostium.  The balloon was inflated to 18 atmospheres. There is no residual stenosis. We were able to re-engage the RCA with the same JR 4 guide. Several doses of nitroglycerin were given intracoronary.  IMPRESSIONS:  1. Widely patent left main coronary artery. 2. Mild to moderate disease left anterior descending artery.  Diagonal disease as noted above in a fairly small caliber vessel. 3. Mild to moderate disease in the left  circumflex artery and its branches. 4. Severe disease in the right coronary artery ostium of 80%. The culprit for today's presentation was the 100% occluded posterior lateral artery. The posterior lateral artery was stented with a 2.25 x 30 resolute drug-eluting stent, postdilated to 2.6 mm.  The ostial RCA was stented with a 4.0 x 15 resolute drug-eluting stent, postdilated up to 4.4 mm in diameter. There was an excellent angiographic result with no residual stenosis.. 5. Normal left ventricular systolic function.  LVEDP 17 mmHg.  Ejection fraction 55 %.  RECOMMENDATION:  She'll be watched in the ICU. Continue dual antiplatelet therapy for at least a year. She requires aggressive secondary prevention. Beta blocker was started. She'll need statin as well.  If she has an uncomplicated course, she may be ready for discharge on Monday.  Continue Angiomax for several hours post procedure.  When the Angiomax runs out, she will likely need IV heparin as long as her groin site is stable.   She will likely need anticoagulation for her atrial fibrillation. At the time of discharge, would consider using Coumadin and Plavix without aspirin to lower her risk of bleeding.  Metoprolol for rate control.

## 2013-09-28 NOTE — Progress Notes (Signed)
CARDIAC REHAB PHASE I   PRE:  Rate/Rhythm: 74 sinus rhythm  BP:  Supine:   Sitting: 158/67  Standing:    SaO2: 99% ra  MODE:  Ambulation: 350 ft   POST:  Rate/Rhythem: 61sinus rhythm  BP:  Supine:   Sitting: 144/52  Standing:    SaO2: 99 % ra 1052-1132 Pt ambulated in hallway x 1 assist without difficulty.  Asymptomatic.  Pt returned to bed sitting position, call light in reach.  Pt is very adamant she is ready to go home.  Pt education completed including importance of medication compliance, risk factor modification, diet and exercise.  Pt oriented to outpatient cardiac rehab program. At pt request, referral will be sent to [place].  Understanding verbalized    Carolyne Littles

## 2013-09-29 DIAGNOSIS — I4891 Unspecified atrial fibrillation: Secondary | ICD-10-CM

## 2013-09-29 DIAGNOSIS — I2119 ST elevation (STEMI) myocardial infarction involving other coronary artery of inferior wall: Principal | ICD-10-CM

## 2013-09-29 LAB — CBC
HCT: 34 % — ABNORMAL LOW (ref 36.0–46.0)
Hemoglobin: 11 g/dL — ABNORMAL LOW (ref 12.0–15.0)
MCH: 27.6 pg (ref 26.0–34.0)
MCHC: 32.4 g/dL (ref 30.0–36.0)
MCV: 85.4 fL (ref 78.0–100.0)
Platelets: 205 10*3/uL (ref 150–400)
RBC: 3.98 MIL/uL (ref 3.87–5.11)
RDW: 14.2 % (ref 11.5–15.5)
WBC: 6.5 10*3/uL (ref 4.0–10.5)

## 2013-09-29 MED ORDER — ALUM & MAG HYDROXIDE-SIMETH 200-200-20 MG/5ML PO SUSP: 30.0000 mL | ORAL | Status: DC | PRN

## 2013-09-29 MED ORDER — AMLODIPINE BESYLATE 5 MG PO TABS
5.0000 mg | ORAL_TABLET | Freq: Every day | ORAL | Status: DC
  Administered 2013-09-29 – 2013-09-30 (×2): 5 mg via ORAL
  Filled 2013-09-29 (×2): qty 1

## 2013-09-29 NOTE — Progress Notes (Signed)
Subjective:   Underwent PCI to ostial RCA and RPL with DES 6/27 for acute MI. Also had new AF on presentation but resolved after PCI.  Remains in NSR. Ambulating unit. No CP/sob. Eager to go home   Intake/Output Summary (Last 24 hours) at 09/29/13 0834 Last data filed at 09/28/13 2200  Gross per 24 hour  Intake 778.33 ml  Output    800 ml  Net -21.67 ml    Current meds: . aspirin EC  81 mg Oral Daily  . atorvastatin  80 mg Oral q1800  . enoxaparin (LOVENOX) injection  40 mg Subcutaneous Q24H  . levothyroxine  50 mcg Oral QAC breakfast  . loperamide  4 mg Oral Q breakfast  . metoprolol succinate  25 mg Oral BID  . pantoprazole  40 mg Oral Daily  . pneumococcal 23 valent vaccine  0.5 mL Intramuscular Tomorrow-1000  . sulfaSALAzine  500 mg Oral TID AC & HS  . ticagrelor  90 mg Oral BID   Infusions:     Objective:  Blood pressure 140/47, pulse 58, temperature 98.8 F (37.1 C), temperature source Oral, resp. rate 23, height 5\' 1"  (1.549 m), weight 75.297 kg (166 lb), SpO2 97.00%. Weight change:   Physical Exam: General:  Well appearing. No resp difficulty HEENT: normal Neck: supple. JVP flat . Carotids 2+ bilat; no bruits. No lymphadenopathy or thryomegaly appreciated. Cor: PMI nondisplaced. Regular rate & rhythm. No rubs, gallops or murmurs. Lungs: clear Abdomen: soft, nontender, nondistended. No hepatosplenomegaly. No bruits or masses. Good bowel sounds. Extremities: no cyanosis, clubbing, rash, edema Neuro: alert & orientedx3, cranial nerves grossly intact. moves all 4 extremities w/o difficulty. Affect pleasant  Telemetry: SR  Lab Results: Basic Metabolic Panel:  Recent Labs Lab 09/27/13 2210 09/27/13 2222 09/28/13 0150  NA 134* 131* 132*  K 3.6* 3.3* 4.3  CL 95* 107 96  CO2 20  --  20  GLUCOSE 150* 152* 124*  BUN 11 10 10   CREATININE 1.23* 1.30* 1.11*  CALCIUM 9.4  --  8.6  MG  --   --  1.7   Liver Function Tests:  Recent Labs Lab  09/27/13 2210 09/28/13 0150  AST 22 84*  ALT 11 16  ALKPHOS 106 98  BILITOT 0.2* 0.3  PROT 8.1 7.1  ALBUMIN 4.0 3.5   No results found for this basename: LIPASE, AMYLASE,  in the last 168 hours No results found for this basename: AMMONIA,  in the last 168 hours CBC:  Recent Labs Lab 09/27/13 2210 09/27/13 2222 09/28/13 0150 09/29/13 0245  WBC 9.9  --  9.3 6.5  NEUTROABS 6.9  --  7.2  --   HGB 12.7 15.0 11.9* 11.0*  HCT 39.0 44.0 36.8 34.0*  MCV 85.0  --  86.8 85.4  PLT 238  --  209 205   Cardiac Enzymes:  Recent Labs Lab 09/28/13 0150 09/28/13 0740  TROPONINI >20.00* >20.00*   BNP: No components found with this basename: POCBNP,  CBG: No results found for this basename: GLUCAP,  in the last 168 hours Microbiology: No results found for this basename: cult   No results found for this basename: CULT, SDES,  in the last 168 hours  Imaging: Dg Chest Port 1 View  09/27/2013   CLINICAL DATA:  Chest pain.  EXAM: PORTABLE CHEST - 1 VIEW  COMPARISON:  None.  FINDINGS: The heart size and mediastinal contours are within normal limits. Both lungs are clear. No pneumothorax or pleural effusion is noted.  Left axillary surgical clips are noted. The visualized skeletal structures are unremarkable.  IMPRESSION: No acute cardiopulmonary abnormality seen.   Electronically Signed   By: Sabino Dick M.D.   On: 09/27/2013 22:33     ASSESSMENT:  1. Inferior STEMI/CAD     --S/p DES to ostial RCA and RPL (6/27)     --trop > 20 2. AF, new    --CHADS2 = 1 3. Crohn's disease 4. HTN  5. HL 6. Hypomagnesemia/hyponatremia  PLAN/DISCUSSION:  Doing very well post MI. Remains in NSR. Suspect AF ischemically mediated.  Will not need coumadin.  Continue post-MI care. Brilinta, asa, b-blocker, cardiac rehab.    BP on upper end of normal range. Will follow. Controlled at home on Azor and lopressor.   Can go to tele. Home in am.    LOS: 2 days    Glori Bickers, MD 09/29/2013,  8:34 AM

## 2013-09-29 NOTE — Progress Notes (Signed)
Received report from [facility] at 1324. Pt arrived to unit 1510. Pt is alert and oriented x 4, able to follow commands and denies any pain at this time. VSS, Dressing to right groin c/d/i. Standard hospital orientation completed, bed in lowest position, call bell within reach. Will continue to monitor closely.

## 2013-09-30 DIAGNOSIS — E785 Hyperlipidemia, unspecified: Secondary | ICD-10-CM | POA: Diagnosis present

## 2013-09-30 DIAGNOSIS — I739 Peripheral vascular disease, unspecified: Secondary | ICD-10-CM | POA: Diagnosis present

## 2013-09-30 DIAGNOSIS — I2510 Atherosclerotic heart disease of native coronary artery without angina pectoris: Secondary | ICD-10-CM | POA: Diagnosis present

## 2013-09-30 LAB — POCT ACTIVATED CLOTTING TIME: ACTIVATED CLOTTING TIME: 495 s

## 2013-09-30 MED ORDER — TICAGRELOR 90 MG PO TABS: 90.0000 mg | ORAL_TABLET | Freq: Two times a day (BID) | ORAL | Status: DC

## 2013-09-30 MED ORDER — ATORVASTATIN CALCIUM 80 MG PO TABS: 80.0000 mg | ORAL_TABLET | Freq: Every day | ORAL | Status: DC

## 2013-09-30 MED ORDER — ACETAMINOPHEN 325 MG PO TABS: 650.0000 mg | ORAL_TABLET | ORAL | Status: DC | PRN

## 2013-09-30 MED ORDER — NITROGLYCERIN 0.4 MG SL SUBL: 0.4000 mg | SUBLINGUAL_TABLET | SUBLINGUAL | Status: AC | PRN

## 2013-09-30 MED FILL — Sodium Chloride IV Soln 0.9%: INTRAVENOUS | Qty: 50 | Status: AC

## 2013-09-30 NOTE — Progress Notes (Signed)
    Subjective:  No chest pain.  Objective:  Vital Signs in the last 24 hours: Temp:  [98.3 F (36.8 C)-98.7 F (37.1 C)] 98.3 F (36.8 C) (06/29 0427) Pulse Rate:  [59-69] 61 (06/29 0427) Resp:  [24-30] 24 (06/29 0427) BP: (120-152)/(45-60) 125/45 mmHg (06/29 0427) SpO2:  [97 %-100 %] 98 % (06/29 0427)  Intake/Output from previous day:  Intake/Output Summary (Last 24 hours) at 09/30/13 0803 Last data filed at 09/29/13 1700  Gross per 24 hour  Intake    840 ml  Output      0 ml  Net    840 ml    Physical Exam: General appearance: alert, cooperative, no distress and mildly obese Neck: no carotid bruit Lungs: clear to auscultation bilaterally Heart: regular rate and rhythm Extremities: Rt groin without hematoma   Rate: 60  Rhythm: normal sinus rhythm and sinus bradycardia  Lab Results:  Recent Labs  09/28/13 0150 09/29/13 0245  WBC 9.3 6.5  HGB 11.9* 11.0*  PLT 209 205    Recent Labs  09/27/13 2210 09/27/13 2222 09/28/13 0150  NA 134* 131* 132*  K 3.6* 3.3* 4.3  CL 95* 107 96  CO2 20  --  20  GLUCOSE 150* 152* 124*  BUN 11 10 10   CREATININE 1.23* 1.30* 1.11*    Recent Labs  09/28/13 0150 09/28/13 0740  TROPONINI >20.00* >20.00*    Recent Labs  09/28/13 0150  INR 2.39*    Imaging: Imaging results have been reviewed   Assessment/Plan:  73 y/o with no prior history of CAD, presented with a STEMI and rapid AF 6/27. She underwent PCI to ostial RCA (80%)and RPL (total) with DES  for acute MI. AF on presentation resolved after PCI.   Principal Problem:   STEMI (ST elevation myocardial infarction) Active Problems:   CAD- 80% RCA, occluded PLA Rx'd with DES 09/28/13   HYPERTENSION   Dyslipidemia   Breast cancer history- surgery only   Unspecified hypothyroidism   Celiac disease   Crohn's colitis   PVD- < 60 bilat ICA by doppler Sept 2014    PLAN:  Discharge, f/u with Dr Radford Pax or APP 2 weeks, f/u lipids and LFTs in 3-6 months. She  was on Azor 5-20 prior to admission- will resume this.   Kerin Ransom PA-C Beeper [phone] 09/30/2013, 8:03 AM  Patient seen with PA, agree with the above note. She presented with inferoposterior MI and is s/p DES to ostial RCA and to the PLV.  EF 55% by LV-gram.  She was in atrial fibrillation transiently around the time of MI, this resolved spontaneously.  It was decided not to anticoagulate her long-term unless atrial fibrillation recurs.  She feels good with no chest pain or dyspnea.  Can go home today on prior home regimen with addition of ASA 81, ticagrelor 90 bid, and atorvastatin 80 daily.  She will need followup in 2 wks (Dr Radford Pax) and will need to do cardiac rehab.  She can get formal echo as outpatient.   Loralie Champagne 09/30/2013 8:38 AM

## 2013-09-30 NOTE — Progress Notes (Signed)
Pt given discharge instructions, medication lists, follow up appointments, and when to call the doctor.  Pt verbalizes understanding. Pt given signs and symptoms of infection. Burton, Sandra McClintock, RN    

## 2013-09-30 NOTE — Progress Notes (Signed)
CARDIAC REHAB PHASE I   PRE:  Rate/Rhythm: 61 SR  BP:  Supine:   Sitting: 148/78  Standing:    SaO2: 98%RA  MODE:  Ambulation: 450 ft   POST:  Rate/Rhythm: 79 SR  BP:  Supine:   Sitting: 150/72  Standing:    SaO2: 97%RA 0905-0940 Pt walked 450 ft on RA with steady gait. C/o right hip pain from arthritis. Denied CP. Reviewed importance of brilinta with stent. Gave brilinta booklet. Reviewed MI restrictions and use of NTG. Referring to CRP 2 [place].   Graylon Good, RN BSN  09/30/2013 9:38 AM

## 2013-09-30 NOTE — Discharge Instructions (Signed)
Coronary Angiogram With Stent, Care After Refer to this sheet in the next few weeks. These instructions provide you with information on caring for yourself after your procedure. Your health care provider may also give you more specific instructions. Your treatment has been planned according to current medical practices, but problems sometimes occur. Call your health care provider if you have any problems or questions after your procedure.  WHAT TO EXPECT AFTER THE PROCEDURE  The insertion site may be tender for a few days after your procedure. HOME CARE INSTRUCTIONS   Only take over-the-counter or prescription medicines as directed by your health care provider. Take all medicines exactly as instructed. Blood thinners may be prescribed after your procedure to improve blood flow through the stent.  Change any bandages (dressings) as directed by your health care provider.   Check your insertion site every day for redness, swelling, or fluid leaking from the insertion.   Do not bathe, swim, or use a hot tub until directed by your health care provider. You may shower. Pat the insertion area dry. Do not rub the insertion area with a washcloth or towel.   Eat a heart-healthy diet. This should include plenty of fresh fruits and vegetables. Meat should be lean cuts. Avoid the following types of food:   Food that is high in salt.   Canned or highly processed food.   Food that is high in saturated fat or sugar.   Fried food.   Make any other lifestyle changes recommended by your health care provider. This may include:   Not using any tobacco products including cigarettes, chewing tobacco or electronic cigarettes.  Managing your weight.   Getting regular exercise.   Managing your blood pressure.   Limiting your alcohol intake.   Managing other health problems, such as diabetes.   If you need an MRI after your heart stent was placed, be sure to tell the health care provider  who orders the MRI that you have a heart stent.   Follow up with your health care provider as directed.  SEEK IMMEDIATE MEDICAL CARE IF:   You develop chest pain, shortness of breath, feel faint, or pass out.  You have bleeding, swelling larger than a walnut, or drainage from the catheter insertion site.  You develop pain, discoloration, coldness, or severe bruising in the leg or arm that held the catheter.  You develop bleeding from any other place such as from the bowels. There may be bright red blood in the urine or stools, or it may appear as black, tarry stools.  You have a fever or chills. MAKE SURE YOU:  Understand these instructions.  Will watch your condition.  Will get help right away if you are not doing well or get worse. Document Released: 10/08/2004 Document Revised: 03/26/2013 Document Reviewed: 08/22/2012 [facility] Patient Information 2015 [place], [place]. This information is not intended to replace advice given to you by your health care provider. Make sure you discuss any questions you have with your health care provider. Myocardial Infarction A myocardial infarction (MI) is damage to the heart that is not reversible. It is also called a heart attack. An MI usually occurs when a heart (coronary) artery becomes blocked or narrowed. This cuts off the blood supply to the heart. When one or more of the heart (coronary) arteries becomes blocked, that area of the heart begins to die. This causes pain felt during an MI.  If you think you might be having an MI, call your local  emergency services immediately (911 in [place] It is recommended that you chew and swallow 3 non-enteric coated baby aspirin if you do not have an aspirin allergy. Do not drive yourself to the hospital or wait to see if your symptoms go away. The sooner MI is treated, the greater the amount of heart muscle saved. Time is muscle. It can save your life. CAUSES  An MI can occur from:  A gradual buildup of a  fatty substance called plaque. When plaque builds up in the arteries, this condition is called atherosclerosis. This buildup can block or reduce the blood supply to the heart artery(s).  A sudden plaque rupture within a heart artery that causes a blood clot (thrombus). A blood clot can block the heart artery which does not allow blood flow to the heart.  A severe tightening (spasm) of the heart artery. This is a less common cause of a heart attack. When a heart artery spasms, it cuts off blood flow through the artery. Spasms can occur in heart arteries that do not have atherosclerosis. RISK FACTORS People at risk for an MI usually have one or more risk factors, such as:  High blood pressure.  High cholesterol.  Smoking.  Gender. Men have a higher heart attack risk.  Overweight/obesity.  Age.  Family history.  Lack of exercise.  Diabetes.  Stress.  Excessive alcohol use.  Street drug use (cocaine and methamphetamines). SYMPTOMS  MI symptoms can vary, such as:  In both men and women, MI symptoms can include the following:  Chest pain. The chest pain may feel like a crushing, squeezing, or "pressure" type feeling. MI pain can be "referred," meaning pain can be caused in one part of the body but felt in another part of the body. Referred MI pain may occur in the left arm, neck, or jaw. Pain may even be felt in the right arm.  Shortness of breath (dyspnea).  Heartburn or indigestion with or without vomiting, shortness of breath, or sweating (diaphoresis).  Sudden, cold sweats.  Sudden lightheadedness.  Upper back pain.  Women can have unique MI symptoms, such as:  Unexplained feelings of nervousness or anxiety.  Discomfort between the shoulder blades (scapula) or upper back.  Tingling in the hands and arms.  In elderly people (regardless of gender), MI symptoms can be subtle, such as:  Sweating (diaphoresis).  Shortness of breath (dyspnea).  General tiredness  (fatigue) or not feeling well (malaise). DIAGNOSIS  Diagnosis of an MI involves several tests such as:  An assessment of your vital signs such as heart rhythm, blood pressure, respiratory rate, and oxygen level.  An EKG (ECG) to look at the electrical activity of your heart.  Blood tests called cardiac markers are drawn at scheduled times to measure proteins or enzymes released by the damaged heart muscle.  A chest X-ray.  An echocardiogram to evaluate heart motion and blood flow.  Coronary angiography (cardiac catheterization). This is a diagnostic procedure to look at the heart arteries. TREATMENT  Acute Intervention. For an MI, the national standard in the [place] is to have an acute intervention in under 90 minutes from the time you get to the hospital. An acute intervention is a special procedure to open up the heart arteries. It is done in a treatment room called a "catheterization lab" (cath lab). Some hospitals do no have a cath lab. If you are having an MI and the hospital does not have a cath lab, the standard is to transport you  to a hospital that has one. In the cath lab, acute intervention includes:  Angioplasty. An angioplasty involves inserting a thin, flexible tube (catheter) into an artery in either your groin or wrist. The catheter is threaded to the heart arteries. A balloon at the end of the catheter is inflated to open a narrowed or blocked heart artery. During an angioplasty procedure, a small mesh tube (stent) may be used to keep the heart artery open. Depending on your condition and health history, one of two types of stents may be placed:  Drug-eluting stent (DES). A DES is coated with a medicine to prevent scar tissue from growing over the stent. With drug-eluting stents, blood thinning medicine will need to be taken for up to a year.  Bare metal stent. This type of stent has no special coating to keep tissue from growing over it. This type of stent is used if  you cannot take blood thinning medicine for a prolonged time or you need surgery in the near future. After a bare metal stent is placed, blood thinning medicine will need to be taken for about a month.  If you are taking blood thinning medicine (anti-platelet therapy) after stent placement, do not stop taking it unless your caregiver says it is okay to do so. Make sure you understand how long you need to take the medicine. Surgical Intervention  If an acute intervention is not successful, surgery may be needed:  Open heart surgery (coronary artery bypass graft, CABG). CABG takes a vein (saphenous vein) from your leg. The vein is then attached to the blocked heart artery which bypasses the blockage. This then allows blood flow to the heart muscle. Additional Interventions  A "clot buster" medicine (thrombolytic) may be given. This medicine can help break up a clot in the heart artery. This medicine may be given if a person cannot get to a cath lab right away.  Intra-aortic balloon pump (IABP). If you have suffered a very severe MI and are too unstable to go to the cath lab or to surgery, an IABP may be used. This is a temporary mechanical device used to increase blood flow to the heart and reduce the workload of the heart until you are stable enough to go to the cath lab or surgery. HOME CARE INSTRUCTIONS After an MI, you may need the following:  Medicine. Take medicine as directed by your caregiver. Medicines after an MI may:  Keep your blood from clotting easily (blood thinners).  Control your blood pressure.  Help lower your cholesterol.  Control abnormal heart rhythms.  Lifestyle changes. Under the guidance of your caregiver, lifestyle changes include:  Quitting smoking, if you smoke. Your caregiver can help you quit.  Being physically active.  Maintaining a healthy weight.  Eating a heart healthy diet. A dietitian can help you learn healthy eating options.  Managing  diabetes.  Reducing stress.  Limiting alcohol intake. SEEK IMMEDIATE MEDICAL CARE IF:   You have severe chest pain, especially if the pain is crushing or pressure-like and spreads to the arms, back, neck, or jaw. This is an emergency. Do not wait to see if the pain will go away. Get medical help at once. Call your local emergency services (911 in the [place] Do not drive yourself to the hospital.  You have shortness of breath during rest, sleep, or with activity.  You have sudden sweating or clammy skin.  You feel sick to your stomach (nauseous) and throw up (vomit).  You suddenly  become lightheaded or dizzy.  You feel your heart beating rapidly or you notice "skipped" beats. MAKE SURE YOU:   Understand these instructions.  Will watch your condition.  Will get help right away if you are not doing well or get worse. Document Released: 03/21/2005 Document Revised: 03/26/2013 Document Reviewed: 08/24/2010 [facility] Patient Information 2015 [place], [place]. This information is not intended to replace advice given to you by your health care provider. Make sure you discuss any questions you have with your health care provider.

## 2013-09-30 NOTE — Care Management Note (Signed)
    Page 1 of 1   09/30/2013     4:30:20 PM CARE MANAGEMENT NOTE 09/30/2013  Patient:  Kellie, Bennett   Account Number:  [id number]  Date Initiated:  09/30/2013  Documentation initiated by:  AMERSON,JULIE  Subjective/Objective Assessment:   Pt adm on 09/27/13 with NSTEMI.  PTA, pt independent, lives with spouse.     Action/Plan:   CM consult for Brilinta.   Anticipated DC Date:  09/30/2013   Anticipated DC Plan:  [place]  CM consult  Medication Assistance      Choice offered to / List presented to:             Status of service:  Completed, signed off Medicare Important Message given?  YES (If response is "NO", the following Medicare IM given date fields will be blank) Date Medicare IM given:  09/30/2013 Date Additional Medicare IM given:    Discharge Disposition:  HOME/SELF CARE  Per UR Regulation:  Reviewed for med. necessity/level of care/duration of stay  If discussed at [place] of Stay Meetings, dates discussed:    Comments:  09/30/13 [name], Rn, Bsn [phone]  BRILINTA 90 MG BID COVER-YES CO-PAY- $ 45.00  [address] -NO PHARMACY: STANDARD PHARMACY   Pt normally goes to [facility] on [address].  This pharmacy has pt's dose of med in stock.  Pt notified of copay result and med availabillity.  Pt given free trial card for Brilinta to use with first Rx.

## 2013-09-30 NOTE — Discharge Summary (Signed)
Patient ID: Kellie Bennett,  MRN: [id number], DOB/AGE: [birth year] 72 y.o.  Admit date: 09/27/2013 Discharge date: 09/30/2013  Primary Care Provider: [facility] Primary Cardiologist: Dr Radford Pax (new)  Discharge Diagnoses Principal Problem:   STEMI (ST elevation myocardial infarction) Active Problems:   CAD- 80% RCA, occluded PLA Rx'd with DES 09/28/13   HYPERTENSION   Dyslipidemia   Breast cancer history- surgery only   Unspecified hypothyroidism   Celiac disease   Crohn's colitis   PVD- < 60 bilat ICA by doppler Sept 2014    Procedures: Urgent cath/PCI with DES 09/28/13   Hospital Course:  Pleasant 73 y/o female followed by Dr Sharlett Iles with a history of HTN, hypothyroidism, remote breast cancer, and Crohn's colitis. She presented with "indigestion" 09/27/13. EKG showed AF with RVR and inferior and lateral ST elevation. She was taken urgently to the cath lab by Dr Irish Lack. Cath revealed an 80% proximal RCA stenosis and a totally occluded PLA. Both sites treated with DES. EF was 55%. She was kept on Heparin post MI but had no further AF and it was felt she would not require long term Coumadin. Statin was added (LDL 139). She can be discharged 09/30/13 and will follow up in two weeks.   Discharge Vitals:  Blood pressure 125/45, pulse 61, temperature 98.3 F (36.8 C), temperature source Oral, resp. rate 24, height 5\' 1"  (1.549 m), weight 166 lb (75.297 kg), SpO2 98.00%.    Labs: Results for orders placed during the hospital encounter of 09/27/13 (from the past 48 hour(s))  CBC     Status: Abnormal   Collection Time    09/29/13  2:45 AM      Result Value Ref Range   WBC 6.5  4.0 - 10.5 K/uL   RBC 3.98  3.87 - 5.11 MIL/uL   Hemoglobin 11.0 (*) 12.0 - 15.0 g/dL   HCT 34.0 (*) 36.0 - 46.0 %   MCV 85.4  78.0 - 100.0 fL   MCH 27.6  26.0 - 34.0 pg   MCHC 32.4  30.0 - 36.0 g/dL   RDW 14.2  11.5 - 15.5 %   Platelets 205  150 - 400 K/uL    Disposition:    Discharge  Medications:    Medication List         acetaminophen 325 MG tablet  Commonly known as:  TYLENOL  Take 2 tablets (650 mg total) by mouth every 4 (four) hours as needed for headache or mild pain.     atorvastatin 80 MG tablet  Commonly known as:  LIPITOR  Take 1 tablet (80 mg total) by mouth daily at 6 PM.     AZOR 5-20 MG per tablet  Generic drug:  amLODipine-olmesartan  Take 1 tablet by mouth daily.     BAYER LOW DOSE 81 MG EC tablet  Generic drug:  aspirin  Take 81 mg by mouth daily. Swallow whole.     cetirizine 1 MG/ML syrup  Commonly known as:  ZYRTEC  Take 10 mg by mouth daily.     levothyroxine 50 MCG tablet  Commonly known as:  SYNTHROID  Take 1 tablet (50 mcg total) by mouth daily before breakfast.     loperamide 2 MG capsule  Commonly known as:  IMODIUM  Take 4 mg by mouth daily.     metoprolol succinate 25 MG 24 hr tablet  Commonly known as:  TOPROL-XL  Take 25 mg by mouth 2 (two) times daily.  nitroGLYCERIN 0.4 MG SL tablet  Commonly known as:  NITROSTAT  Place 1 tablet (0.4 mg total) under the tongue every 5 (five) minutes as needed for chest pain (CP or SOB).     omeprazole 20 MG capsule  Commonly known as:  PRILOSEC  Take 20 mg by mouth 2 (two) times daily.     sulfaSALAzine 500 MG tablet  Commonly known as:  AZULFIDINE  Take 500 mg by mouth 4 (four) times daily.     ticagrelor 90 MG Tabs tablet  Commonly known as:  BRILINTA  Take 1 tablet (90 mg total) by mouth 2 (two) times daily.     Vitamin D (Ergocalciferol) 50000 UNITS Caps capsule  Commonly known as:  DRISDOL  Take 50,000 Units by mouth every 7 (seven) days.         Duration of Discharge Encounter: Greater than 30 minutes including physician time.  Angelena Form PA-C 09/30/2013 8:17 AM

## 2013-10-23 ENCOUNTER — Ambulatory Visit (INDEPENDENT_AMBULATORY_CARE_PROVIDER_SITE_OTHER): Payer: Medicare Other | Admitting: Physician Assistant

## 2013-10-23 ENCOUNTER — Encounter: Payer: Self-pay | Admitting: Physician Assistant

## 2013-10-23 VITALS — BP 132/80 | HR 60 | Ht 61.0 in | Wt 172.0 lb

## 2013-10-23 DIAGNOSIS — I739 Peripheral vascular disease, unspecified: Secondary | ICD-10-CM

## 2013-10-23 DIAGNOSIS — I480 Paroxysmal atrial fibrillation: Secondary | ICD-10-CM

## 2013-10-23 DIAGNOSIS — I10 Essential (primary) hypertension: Secondary | ICD-10-CM

## 2013-10-23 DIAGNOSIS — I2510 Atherosclerotic heart disease of native coronary artery without angina pectoris: Secondary | ICD-10-CM

## 2013-10-23 DIAGNOSIS — E785 Hyperlipidemia, unspecified: Secondary | ICD-10-CM

## 2013-10-23 DIAGNOSIS — I4891 Unspecified atrial fibrillation: Secondary | ICD-10-CM

## 2013-10-23 NOTE — Patient Instructions (Signed)
Your physician recommends that you continue on your current medications as directed. Please refer to the Current Medication list given to you today.  Your physician recommends that you return for a FASTING lipid profile and ALT in 6 weeks.  Your physician recommends that you schedule a follow-up appointment in: 6-8 weeks with Dr Radford Pax

## 2013-10-23 NOTE — Progress Notes (Signed)
Cardiology Office Note    Date:  10/23/2013   ID:  Kellie Bennett, DOB January 26, 1941, MRN [id number]  PCP:  Donnajean Lopes, MD  Cardiologist:  Dr. Fransico Him      History of Present Illness: Kellie Bennett is a 73 y.o. female with a hx of HTN, hypothyroidism, Crohn's, breast CA.  She was admitted 6/26-6/29 with an inferoposterior STEMI.  She was noted to be in AFib with RVR upon presentation.  Cardiac catheterization demonstrated high-grade ostial RCA disease and an occluded posterior lateral. Both vessels were treated with DES. She remained in NSR throughout the remainder of admission.  She was not placed on long-term Coumadin.  It was felt she would not need anticoagulation unless she had recurrent atrial fibrillation (AFib felt to be ischemia mediated).  She returns for follow up.   She notes occasional right groin pain.  The patient denies any chest pain, significant dyspnea, syncope, orthopnea, PND, edema.   Studies:  - LHC (09/28/13):  Diffuse disease in the mid LAD, mid Dx 80%, mild to moderate disease mid CFX, ostial RCA 80%, PL occluded, EF 55% >>> PCI:  2.25 x 30 resolute DES to the PL; 4.0 x 15 resolute DES to the ostial RCA  - Carotid US (12/2012):  R <40%; L 40-59%   Recent Labs: 09/28/2013: ALT 16; Creatinine 1.11*; HDL Cholesterol by NMR 28*; LDL (calc) 139*; Potassium 4.3; TSH 4.430  09/29/2013: Hemoglobin 11.0*   Wt Readings from Last 3 Encounters:  10/23/13 172 lb (78.019 kg)  09/27/13 166 lb (75.297 kg)  09/27/13 166 lb (75.297 kg)     Past Medical History  Diagnosis Date  . History of chicken pox   . History of blood clots   . History of blood transfusion   . Breast cancer   . GERD (gastroesophageal reflux disease)   . Hypertension   . Crohn's colitis   . Celiac disease   . Acute myocardial infarction of inferoposterior wall, initial episode of care     Current Outpatient Prescriptions  Medication Sig Dispense Refill  . acetaminophen (TYLENOL) 325 MG  tablet Take 2 tablets (650 mg total) by mouth every 4 (four) hours as needed for headache or mild pain.      Marland Kitchen amLODipine-olmesartan (AZOR) 5-20 MG per tablet Take 1 tablet by mouth daily.      Marland Kitchen aspirin (BAYER LOW DOSE) 81 MG EC tablet Take 81 mg by mouth daily. Swallow whole.      Marland Kitchen atorvastatin (LIPITOR) 80 MG tablet Take 1 tablet (80 mg total) by mouth daily at 6 PM.  30 tablet  11  . cetirizine (ZYRTEC) 1 MG/ML syrup Take 10 mg by mouth daily.      Marland Kitchen levothyroxine (SYNTHROID) 50 MCG tablet Take 1 tablet (50 mcg total) by mouth daily before breakfast.  30 tablet  11  . loperamide (IMODIUM) 2 MG capsule Take 4 mg by mouth daily.       . metoprolol succinate (TOPROL-XL) 25 MG 24 hr tablet Take 25 mg by mouth 2 (two) times daily.      . nitroGLYCERIN (NITROSTAT) 0.4 MG SL tablet Place 1 tablet (0.4 mg total) under the tongue every 5 (five) minutes as needed for chest pain (CP or SOB).  25 tablet  2  . omeprazole (PRILOSEC) 20 MG capsule Take 20 mg by mouth 2 (two) times daily.      Marland Kitchen sulfaSALAzine (AZULFIDINE) 500 MG tablet Take 500 mg by mouth 4 (four)  times daily.       . ticagrelor (BRILINTA) 90 MG TABS tablet Take 1 tablet (90 mg total) by mouth 2 (two) times daily.  60 tablet  11  . Vitamin D, Ergocalciferol, (DRISDOL) 50000 UNITS CAPS capsule Take 50,000 Units by mouth every 7 (seven) days.       No current facility-administered medications for this visit.    Allergies:   Codeine and Meperidine hcl   Social History:  The patient  reports that she has never smoked. She does not have any smokeless tobacco history on file. She reports that she drinks alcohol. She reports that she does not use illicit drugs.   Family History:  The patient's family history includes Alzheimer's disease in her father; Cancer in her maternal grandmother and mother; Heart attack in her son; Hyperlipidemia in her mother; Hypertension in her daughter, father, other, and son; Stroke in her maternal grandfather and  son; Thyroid disease in her maternal grandmother.   ROS:  Please see the history of present illness.   She has some occasional nausea.  No vomiting.  No bleeding.   All other systems reviewed and negative.   PHYSICAL EXAM: VS:  BP 132/80  Pulse 60  Ht 5\' 1"  (1.549 m)  Wt 172 lb (78.019 kg)  BMI 32.52 kg/m2 Well nourished, well developed, in no acute distress HEENT: normal Neck: no JVD Cardiac:  normal S1, S2; RRR; no murmur Lungs:  clear to auscultation bilaterally, no wheezing, rhonchi or rales Abd: soft, nontender, no hepatomegaly Ext: no edemaright groin without hematoma or bruit  Skin: warm and dry Neuro:  CNs 2-12 intact, no focal abnormalities noted  EKG:  NSR, HR 60, inferolateral T wave inversions, no change from prior tracing     ASSESSMENT AND PLAN:  Coronary artery disease involving native coronary artery of native heart without angina pectoris She is doing well after recent inferior-posterior STEMI treated with DES x2. We discussed the importance of dual antiplatelet therapy. Brilinta may become an issue secondary to cost. I asked her to contact us if this becomes a problem. She could likely be changed to Plavix after a minimum of 30 days of Brilinta. I have encouraged her to start cardiac rehabilitation.  HYPERTENSION Controlled.  Dyslipidemia Continue statin. Arrange followup lipids and LFTs.  PVD- < 60 bilat ICA by doppler Sept 2014 Follow up as indicated. Continue aspirin, statin.  Paroxysmal atrial fibrillation  Maintaining NSR. This was likely related to ischemia from her STEMI.  Disposition:  Follow up with Dr. Radford Pax in 6-8 weeks.   Signed, Versie Starks, MHS 10/23/2013 9:07 AM    [facility] [address], [place], [place]  [address] Phone: [phone]; Fax: [phone]

## 2013-12-03 ENCOUNTER — Other Ambulatory Visit (INDEPENDENT_AMBULATORY_CARE_PROVIDER_SITE_OTHER): Payer: Medicare Other

## 2013-12-03 DIAGNOSIS — E785 Hyperlipidemia, unspecified: Secondary | ICD-10-CM

## 2013-12-03 LAB — HEPATIC FUNCTION PANEL
ALK PHOS: 95 U/L (ref 39–117)
ALT: 10 U/L (ref 0–35)
AST: 21 U/L (ref 0–37)
Albumin: 3.7 g/dL (ref 3.5–5.2)
BILIRUBIN DIRECT: 0 mg/dL (ref 0.0–0.3)
Total Bilirubin: 0.5 mg/dL (ref 0.2–1.2)
Total Protein: 6.8 g/dL (ref 6.0–8.3)

## 2013-12-03 LAB — LIPID PANEL
Cholesterol: 139 mg/dL (ref 0–200)
HDL: 30.3 mg/dL — ABNORMAL LOW (ref >39.00)
LDL Cholesterol: 73 mg/dL (ref 0–99)
NONHDL: 108.7
Total CHOL/HDL Ratio: 5
Triglycerides: 177 mg/dL — ABNORMAL HIGH (ref 0.0–149.0)
VLDL: 35.4 mg/dL (ref 0.0–40.0)

## 2013-12-04 ENCOUNTER — Telehealth: Payer: Self-pay | Admitting: Interventional Cardiology

## 2013-12-04 NOTE — Telephone Encounter (Signed)
pt notified about lab results with verbal understanding. Pt asked for copy to be mailed to her, I said no problem and will put in the mail today.

## 2013-12-04 NOTE — Telephone Encounter (Signed)
New message ° ° ° ° ° °Returning a nurses call °

## 2013-12-31 ENCOUNTER — Ambulatory Visit: Payer: Medicare Other | Admitting: Cardiology

## 2014-01-06 ENCOUNTER — Ambulatory Visit: Payer: Medicare Other | Admitting: Cardiology

## 2014-01-31 ENCOUNTER — Other Ambulatory Visit: Payer: Self-pay | Admitting: Endocrinology

## 2014-02-04 ENCOUNTER — Encounter: Payer: Self-pay | Admitting: Interventional Cardiology

## 2014-02-04 ENCOUNTER — Ambulatory Visit (INDEPENDENT_AMBULATORY_CARE_PROVIDER_SITE_OTHER): Payer: Medicare Other | Admitting: Interventional Cardiology

## 2014-02-04 VITALS — BP 120/62 | HR 63 | Ht 61.0 in | Wt 170.7 lb

## 2014-02-04 DIAGNOSIS — E785 Hyperlipidemia, unspecified: Secondary | ICD-10-CM

## 2014-02-04 DIAGNOSIS — I10 Essential (primary) hypertension: Secondary | ICD-10-CM

## 2014-02-04 DIAGNOSIS — R600 Localized edema: Secondary | ICD-10-CM

## 2014-02-04 DIAGNOSIS — K219 Gastro-esophageal reflux disease without esophagitis: Secondary | ICD-10-CM

## 2014-02-04 DIAGNOSIS — I252 Old myocardial infarction: Secondary | ICD-10-CM

## 2014-02-04 DIAGNOSIS — I2510 Atherosclerotic heart disease of native coronary artery without angina pectoris: Secondary | ICD-10-CM

## 2014-02-04 MED ORDER — ATORVASTATIN CALCIUM 40 MG PO TABS: 40.0000 mg | ORAL_TABLET | Freq: Every day | ORAL | Status: DC

## 2014-02-04 MED ORDER — CLOPIDOGREL BISULFATE 75 MG PO TABS: 75.0000 mg | ORAL_TABLET | Freq: Every day | ORAL | Status: DC

## 2014-02-04 MED ORDER — IRBESARTAN 150 MG PO TABS: 150.0000 mg | ORAL_TABLET | Freq: Every day | ORAL | Status: DC

## 2014-02-04 MED ORDER — AMLODIPINE BESYLATE 5 MG PO TABS: 5.0000 mg | ORAL_TABLET | Freq: Every day | ORAL | Status: DC

## 2014-02-04 MED ORDER — PANTOPRAZOLE SODIUM 40 MG PO TBEC: 40.0000 mg | DELAYED_RELEASE_TABLET | Freq: Every day | ORAL | Status: DC

## 2014-02-04 NOTE — Patient Instructions (Addendum)
Your physician has recommended you make the following change in your medication:  1. Please finish current supply of Brilinta and then switch to Plavix 75mg  once a day (on the first day of taking plavix, please take 300mg -4 tablets) 2. Please finish current supply of Azor and then switch to Amlodipine 5mg  once a day and Avapro 150mg  once a day 3. DECREASE Atorvastatin to 40mg  once a day 4. Please STOP Omeprazole when you start Plavix, START Pantoprazole 40mg  once a day  Your physician recommends that you return for lab work: BMP 1 WEEK after switching from Azor (Please call the office to schedule this lab appointment once you have changed medication)  Your physician recommends that you return for a FASTING LIPID and LIVER profile in 3 MONTHS--nothing to eat or drink after midnight, lab opens at 7:30 AM  Your physician wants you to follow-up in: 6 MONTHS with Dr Irish Lack.  You will receive a reminder letter in the mail two months in advance. If you don't receive a letter, please call our office to schedule the follow-up appointment.

## 2014-02-04 NOTE — Progress Notes (Signed)
Patient ID: Kellie Bennett, female   DOB: 01/26/1941, 73 y.o.   MRN: [id number]    [place], [address] [place], [place]  [address] Phone: [phone] Fax:  [phone]  Date:  02/04/2014   ID:  Kellie Bennett, DOB 14-May-1940, MRN [id number]  PCP:  Donnajean Lopes, MD      History of Present Illness: Kellie Bennett is a 73 y.o. female who had an inferior MI in 6/15.  She had stents to the posterolateral and ostial RCA.  She has felt well since that time. She has not had the same type of chest pain that she had with MI.  She has been walking regularly and has no problems with walking when the weather is good.  She does a lot of work around the house as well as her husband is somewhat debilitated.   She has had many issues with the cost of medicines of late.     Wt Readings from Last 3 Encounters:  02/04/14 170 lb 11.2 oz (77.429 kg)  10/23/13 172 lb (78.019 kg)  09/27/13 166 lb (75.297 kg)     Past Medical History  Diagnosis Date  . History of chicken pox   . History of blood clots   . History of blood transfusion   . Breast cancer   . GERD (gastroesophageal reflux disease)   . Hypertension   . Crohn's colitis   . Celiac disease   . Acute myocardial infarction of inferoposterior wall, initial episode of care     Current Outpatient Prescriptions  Medication Sig Dispense Refill  . amLODipine-olmesartan (AZOR) 5-20 MG per tablet Take 1 tablet by mouth daily.    Marland Kitchen aspirin (BAYER LOW DOSE) 81 MG EC tablet Take 81 mg by mouth daily. Swallow whole.    Marland Kitchen atorvastatin (LIPITOR) 80 MG tablet Take 1 tablet (80 mg total) by mouth daily at 6 PM. 30 tablet 11  . Biotin 5000 MCG CAPS Take by mouth.    . cetirizine (ZYRTEC) 1 MG/ML syrup Take 10 mg by mouth daily.    Marland Kitchen loperamide (IMODIUM) 2 MG capsule Take 4 mg by mouth daily.     . metoprolol succinate (TOPROL-XL) 25 MG 24 hr tablet Take 25 mg by mouth 2 (two) times daily.    . nitroGLYCERIN (NITROSTAT) 0.4 MG SL tablet Place 1 tablet  (0.4 mg total) under the tongue every 5 (five) minutes as needed for chest pain (CP or SOB). 25 tablet 2  . omeprazole (PRILOSEC) 20 MG capsule Take 20 mg by mouth 2 (two) times daily.    Marland Kitchen sulfaSALAzine (AZULFIDINE) 500 MG tablet Take 500 mg by mouth 4 (four) times daily.     Marland Kitchen SYNTHROID 50 MCG tablet TAKE ONE TABLET BY MOUTH ONCE DAILY BEFORE  BREAKFAST 30 tablet 0  . ticagrelor (BRILINTA) 90 MG TABS tablet Take 1 tablet (90 mg total) by mouth 2 (two) times daily. 60 tablet 11  . vitamin B-12 (CYANOCOBALAMIN) 1000 MCG tablet Take 1,000 mcg by mouth daily.    . Vitamin D, Ergocalciferol, (DRISDOL) 50000 UNITS CAPS capsule Take 50,000 Units by mouth every 7 (seven) days.     No current facility-administered medications for this visit.    Allergies:    Allergies  Allergen Reactions  . Codeine Nausea And Vomiting  . Meperidine Hcl Nausea And Vomiting    Demerol    Social History:  The patient  reports that she has never smoked. She does not have  any smokeless tobacco history on file. She reports that she drinks alcohol. She reports that she does not use illicit drugs.   Family History:  The patient's family history includes Alzheimer's disease in her father; Cancer in her maternal grandmother and mother; Heart attack in her son; Hyperlipidemia in her mother; Hypertension in her daughter, father, other, and son; Stroke in her maternal grandfather and son; Thyroid disease in her maternal grandmother.   ROS:  Please see the history of present illness.  No nausea, vomiting.  No fevers, chills.  No focal weakness.  No dysuria.    All other systems reviewed and negative.   PHYSICAL EXAM: VS:  BP 120/62 mmHg  Pulse 63  Ht 5\' 1"  (1.549 m)  Wt 170 lb 11.2 oz (77.429 kg)  BMI 32.27 kg/m2  SpO2 98% Well nourished, well developed, in no acute distress HEENT: normal Neck: no JVD, no carotid bruits Cardiac:  normal S1, S2; RRR;  Lungs:  clear to auscultation bilaterally, no wheezing, rhonchi or  rales Abd: soft, nontender, no hepatomegaly Ext: no edema; 2+ right radial Skin: warm and dry Neuro:   no focal abnormalities noted Psych: normal affect     ASSESSMENT AND PLAN:  1. CAD/ Inferior MI: Change Brilinta to Plavix 75 mg daily for cost savings.  On day one of Plavix, take 300 mg; then take 75 mg /day. 2. HTN: Azor is expensive for her.  WIll change to Amlodipine 5 mg daily.  Change olmesartan to irbesartan 150 mg daily. Check a BMet in one week.   3. Hyperlipidemia: LDL 73 in September.  Atorvastatin is expensive for her. With lifestyle modifications and weight loss, will see if her LDL will be controlled on atorva 40 mg daily. 4. Edema: Occurred prior to MI but none since.   5.  GERD: Change prilosec to protonix due to possible interaction with Plavix.   Signed, Mina Marble, MD, [facility] 02/04/2014 4:34 PM

## 2014-03-05 ENCOUNTER — Telehealth: Payer: Self-pay | Admitting: Interventional Cardiology

## 2014-03-05 ENCOUNTER — Ambulatory Visit (INDEPENDENT_AMBULATORY_CARE_PROVIDER_SITE_OTHER): Payer: Medicare Other | Admitting: Endocrinology

## 2014-03-05 ENCOUNTER — Encounter: Payer: Self-pay | Admitting: *Deleted

## 2014-03-05 ENCOUNTER — Encounter: Payer: Self-pay | Admitting: Endocrinology

## 2014-03-05 VITALS — BP 132/64 | HR 75 | Temp 97.7°F | Ht 61.0 in | Wt 167.0 lb

## 2014-03-05 DIAGNOSIS — E042 Nontoxic multinodular goiter: Secondary | ICD-10-CM

## 2014-03-05 DIAGNOSIS — I2510 Atherosclerotic heart disease of native coronary artery without angina pectoris: Secondary | ICD-10-CM

## 2014-03-05 NOTE — Telephone Encounter (Signed)
Attempted to call the patient. Left message I will call back.

## 2014-03-05 NOTE — Telephone Encounter (Signed)
The patient called back. She wanted to make sure that her dose of plavix was correct. She took 300 mg this morning and will start once daily plavix tomorrow. She was concerned because her Brilinta was twice daily. I advised once daily dosing on plavix is correct. She voices understanding. She was told to come for lab 1 week after starting. Will order BMP/ CBC. She will have labwork on 12/4.

## 2014-03-05 NOTE — Patient Instructions (Addendum)
blood tests are being requested for you today.  We'll let you know about the results. With time, your need for the thyroid pill would likely either increase or decrease, but is unlikely to stay the same.  Let's recheck the ultrasound in 1 more year.   Please return in 1 year.

## 2014-03-05 NOTE — Progress Notes (Signed)
Subjective:    Patient ID: Kellie Bennett, female    DOB: March 19, 1941, 73 y.o.   MRN: [id number]  HPI Pt returns for f/u of chronic primary hypothyroidism (dx'ed in the 1970's; she then took synthroid x a few years; since then, she says she was told that the thyroid was back to normal, off any supplement; in 2013, she was restarted on synthroid, after a TSH was elevated; also in 2013, she had an ultrasound showing multinodular goiter; she had bx, and cytology showed atypical follicular lesion with small lymphocytes).  Pt states moderate hair loss and fatigue.   Past Medical History  Diagnosis Date  . History of chicken pox   . History of blood clots   . History of blood transfusion   . Breast cancer   . GERD (gastroesophageal reflux disease)   . Hypertension   . Crohn's colitis   . Celiac disease   . Acute myocardial infarction of inferoposterior wall, initial episode of care     Past Surgical History  Procedure Laterality Date  . Cholecystectomy  2007  . Breast surgery  1987    Breast Biopsy   . Dilation and curettage of uterus      History   Social History  . Marital Status: Married    Spouse Name: N/A    Number of Children: 6  . Years of Education: 12   Occupational History  . Retired    Social History Main Topics  . Smoking status: Never Smoker   . Smokeless tobacco: Not on file  . Alcohol Use: Yes     Comment: occasional  . Drug Use: No  . Sexual Activity: Not on file   Other Topics Concern  . Not on file   Social History Narrative   Regular exercise-no   Caffeine Use-yes    Current Outpatient Prescriptions on File Prior to Visit  Medication Sig Dispense Refill  . amLODipine (NORVASC) 5 MG tablet Take 1 tablet (5 mg total) by mouth daily. 90 tablet 3  . aspirin (BAYER LOW DOSE) 81 MG EC tablet Take 81 mg by mouth daily. Swallow whole.    Marland Kitchen atorvastatin (LIPITOR) 40 MG tablet Take 1 tablet (40 mg total) by mouth daily at 6 PM. 90 tablet 3  . Biotin 5000  MCG CAPS Take by mouth.    . cetirizine (ZYRTEC) 1 MG/ML syrup Take 10 mg by mouth daily.    . clopidogrel (PLAVIX) 75 MG tablet Take 1 tablet (75 mg total) by mouth daily. 90 tablet 3  . irbesartan (AVAPRO) 150 MG tablet Take 1 tablet (150 mg total) by mouth daily. 90 tablet 3  . loperamide (IMODIUM) 2 MG capsule Take 4 mg by mouth daily.     . metoprolol succinate (TOPROL-XL) 25 MG 24 hr tablet Take 25 mg by mouth 2 (two) times daily.    . nitroGLYCERIN (NITROSTAT) 0.4 MG SL tablet Place 1 tablet (0.4 mg total) under the tongue every 5 (five) minutes as needed for chest pain (CP or SOB). 25 tablet 2  . pantoprazole (PROTONIX) 40 MG tablet Take 1 tablet (40 mg total) by mouth daily. 90 tablet 3  . sulfaSALAzine (AZULFIDINE) 500 MG tablet Take 500 mg by mouth 4 (four) times daily.     Marland Kitchen SYNTHROID 50 MCG tablet TAKE ONE TABLET BY MOUTH ONCE DAILY BEFORE  BREAKFAST 30 tablet 0  . vitamin B-12 (CYANOCOBALAMIN) 1000 MCG tablet Take 1,000 mcg by mouth daily.    . Vitamin  D, Ergocalciferol, (DRISDOL) 50000 UNITS CAPS capsule Take 50,000 Units by mouth every 7 (seven) days.     No current facility-administered medications on file prior to visit.    Allergies  Allergen Reactions  . Codeine Nausea And Vomiting  . Meperidine Hcl Nausea And Vomiting    Demerol    Family History  Problem Relation Age of Onset  . Cancer Maternal Grandmother     Thyroid Cancer  . Stroke Maternal Grandfather   . Hypertension Other     Parent  . Cancer Mother   . Alzheimer's disease Father   . Hypertension Father   . Hyperlipidemia Mother   . Thyroid disease Maternal Grandmother   . Stroke Son   . Hypertension Son   . Hypertension Daughter   . Heart attack Son   . Thyroid disease Son     BP 132/64 mmHg  Pulse 75  Temp(Src) 97.7 F (36.5 C) (Oral)  Ht 5\' 1"  (1.549 m)  Wt 167 lb (75.751 kg)  BMI 31.57 kg/m2  SpO2 96%    Review of Systems She has lost a few lbs.      Objective:   Physical  Exam VITAL SIGNS:  See vs page GENERAL: no distress NECK: thyroid is slightly enlarged, with multinodular surface.    Lab Results  Component Value Date   TSH 2.47 03/05/2014      Assessment & Plan:  Multinodular goiter, clinically unchanged Chronic primary hypothyroidism, well-replaced    Patient is advised the following: Patient Instructions  blood tests are being requested for you today.  We'll let you know about the results. With time, your need for the thyroid pill would likely either increase or decrease, but is unlikely to stay the same.  Let's recheck the ultrasound in 1 more year.   Please return in 1 year.

## 2014-03-05 NOTE — Telephone Encounter (Signed)
F/u ° ° °Pt returning your call °

## 2014-03-05 NOTE — Telephone Encounter (Signed)
New message      Questions about new medication Dr Irish Lack prescribed

## 2014-03-05 NOTE — Telephone Encounter (Signed)
I left a message for the patient to call. 

## 2014-03-06 LAB — TSH: TSH: 2.47 u[IU]/mL (ref 0.35–4.50)

## 2014-03-07 ENCOUNTER — Other Ambulatory Visit: Payer: Self-pay

## 2014-03-07 MED ORDER — SYNTHROID 50 MCG PO TABS: ORAL_TABLET | ORAL | Status: DC

## 2014-03-13 ENCOUNTER — Encounter (HOSPITAL_COMMUNITY): Payer: Self-pay | Admitting: Interventional Cardiology

## 2014-03-13 ENCOUNTER — Other Ambulatory Visit (INDEPENDENT_AMBULATORY_CARE_PROVIDER_SITE_OTHER): Payer: Medicare Other | Admitting: *Deleted

## 2014-03-13 DIAGNOSIS — E785 Hyperlipidemia, unspecified: Secondary | ICD-10-CM

## 2014-03-13 DIAGNOSIS — I10 Essential (primary) hypertension: Secondary | ICD-10-CM

## 2014-03-13 DIAGNOSIS — I2510 Atherosclerotic heart disease of native coronary artery without angina pectoris: Secondary | ICD-10-CM

## 2014-03-13 LAB — CBC WITH DIFFERENTIAL/PLATELET
BASOS PCT: 0.5 % (ref 0.0–3.0)
Basophils Absolute: 0 10*3/uL (ref 0.0–0.1)
EOS PCT: 0.1 % (ref 0.0–5.0)
Eosinophils Absolute: 0 10*3/uL (ref 0.0–0.7)
HCT: 34.6 % — ABNORMAL LOW (ref 36.0–46.0)
HEMOGLOBIN: 11.3 g/dL — AB (ref 12.0–15.0)
Lymphocytes Relative: 28.7 % (ref 12.0–46.0)
Lymphs Abs: 2.1 10*3/uL (ref 0.7–4.0)
MCHC: 32.5 g/dL (ref 30.0–36.0)
MCV: 85.3 fl (ref 78.0–100.0)
MONO ABS: 0.7 10*3/uL (ref 0.1–1.0)
Monocytes Relative: 9.8 % (ref 3.0–12.0)
Neutro Abs: 4.6 10*3/uL (ref 1.4–7.7)
Neutrophils Relative %: 60.9 % (ref 43.0–77.0)
Platelets: 242 10*3/uL (ref 150.0–400.0)
RBC: 4.06 Mil/uL (ref 3.87–5.11)
RDW: 15.4 % (ref 11.5–15.5)
WBC: 7.5 10*3/uL (ref 4.0–10.5)

## 2014-03-13 LAB — BASIC METABOLIC PANEL
BUN: 16 mg/dL (ref 6–23)
CHLORIDE: 107 meq/L (ref 96–112)
CO2: 21 mEq/L (ref 19–32)
CREATININE: 1.3 mg/dL — AB (ref 0.4–1.2)
Calcium: 9 mg/dL (ref 8.4–10.5)
GFR: 41.95 mL/min — ABNORMAL LOW (ref >60.00)
Glucose, Bld: 84 mg/dL (ref 70–99)
Potassium: 3.6 mEq/L (ref 3.5–5.1)
SODIUM: 135 meq/L (ref 135–145)

## 2014-05-06 ENCOUNTER — Other Ambulatory Visit (INDEPENDENT_AMBULATORY_CARE_PROVIDER_SITE_OTHER): Payer: Medicare Other | Admitting: *Deleted

## 2014-05-06 DIAGNOSIS — I10 Essential (primary) hypertension: Secondary | ICD-10-CM

## 2014-05-06 DIAGNOSIS — E785 Hyperlipidemia, unspecified: Secondary | ICD-10-CM

## 2014-05-06 LAB — LIPID PANEL
CHOLESTEROL: 157 mg/dL (ref 0–200)
HDL: 37.6 mg/dL — ABNORMAL LOW (ref >39.00)
LDL CALC: 83 mg/dL (ref 0–99)
NonHDL: 119.4
Total CHOL/HDL Ratio: 4
Triglycerides: 183 mg/dL — ABNORMAL HIGH (ref 0.0–149.0)
VLDL: 36.6 mg/dL (ref 0.0–40.0)

## 2014-05-06 LAB — HEPATIC FUNCTION PANEL
ALT: 9 U/L (ref 0–35)
AST: 16 U/L (ref 0–37)
Albumin: 4.3 g/dL (ref 3.5–5.2)
Alkaline Phosphatase: 85 U/L (ref 39–117)
BILIRUBIN DIRECT: 0.1 mg/dL (ref 0.0–0.3)
BILIRUBIN TOTAL: 0.5 mg/dL (ref 0.2–1.2)
Total Protein: 7.3 g/dL (ref 6.0–8.3)

## 2014-05-09 ENCOUNTER — Other Ambulatory Visit: Payer: Self-pay | Admitting: *Deleted

## 2014-07-16 ENCOUNTER — Telehealth: Payer: Self-pay | Admitting: Endocrinology

## 2014-07-16 NOTE — Telephone Encounter (Signed)
please call patient: We received PA for synthroid.  If you want me to do PA, please let me know what the PA should say. Also, please note there is a charge for the PA, and it is unlikely to be approved.

## 2014-07-16 NOTE — Telephone Encounter (Signed)
Contacted pt and advised of note below. Pt agreed to having the generic, Levothyroxine sent to her pharmacy.

## 2014-07-18 ENCOUNTER — Telehealth: Payer: Self-pay | Admitting: Endocrinology

## 2014-07-18 NOTE — Telephone Encounter (Signed)
Pt needs the generic rx for levothyroxine called into [facility] for 90 day supply please call pt when this is done [phone]

## 2014-07-21 ENCOUNTER — Telehealth: Payer: Self-pay

## 2014-07-21 ENCOUNTER — Other Ambulatory Visit: Payer: Self-pay

## 2014-07-21 MED ORDER — SYNTHROID 50 MCG PO TABS: ORAL_TABLET | ORAL | Status: DC

## 2014-07-21 MED ORDER — LEVOTHYROXINE SODIUM 50 MCG PO TABS: 50.0000 ug | ORAL_TABLET | Freq: Every day | ORAL | Status: AC

## 2014-07-21 NOTE — Telephone Encounter (Signed)
Okay 

## 2014-07-21 NOTE — Telephone Encounter (Signed)
Received notice from pt's pharmacy stating the Synthroid is not covered under her insurance. The generic Levothyroxine is the alternative.  Please advise if ok to change during Dr. Cordelia Pen absence. Thanks!

## 2014-07-21 NOTE — Telephone Encounter (Signed)
Rx for Levothyroxine 50 mcg sent.

## 2014-07-29 ENCOUNTER — Telehealth: Payer: Self-pay | Admitting: Interventional Cardiology

## 2014-07-29 NOTE — Telephone Encounter (Signed)
Will forward to Dr. Varanasi for review and advisement.  

## 2014-07-29 NOTE — Telephone Encounter (Signed)
Request for surgical clearance:  1. What type of surgery is being performed? Cataract removal   2. When is this surgery scheduled? 08/19/2014  3. Are there any medications that need to be held prior to surgery and how long? Not that thay know of   4. Name of physician performing surgery? Dr. Vevelyn Royals  5. What is your office phone and fax number? Fax # [phone]  Comments: They plan on using 1 mg Versed

## 2014-07-31 NOTE — Telephone Encounter (Signed)
No further testing needed before cataract surgery.  She should let them know about her antiplatelet meds but these are typically not stopped before cataract surgery.

## 2014-08-05 NOTE — Telephone Encounter (Signed)
Note faxed to Dr Vevelyn Royals through the [facility] system.

## 2014-08-11 ENCOUNTER — Ambulatory Visit (INDEPENDENT_AMBULATORY_CARE_PROVIDER_SITE_OTHER): Payer: Medicare Other | Admitting: Interventional Cardiology

## 2014-08-11 ENCOUNTER — Encounter: Payer: Self-pay | Admitting: Interventional Cardiology

## 2014-08-11 VITALS — BP 142/76 | HR 64 | Ht 61.0 in | Wt 171.0 lb

## 2014-08-11 DIAGNOSIS — E785 Hyperlipidemia, unspecified: Secondary | ICD-10-CM

## 2014-08-11 DIAGNOSIS — I2510 Atherosclerotic heart disease of native coronary artery without angina pectoris: Secondary | ICD-10-CM | POA: Diagnosis not present

## 2014-08-11 DIAGNOSIS — I252 Old myocardial infarction: Secondary | ICD-10-CM

## 2014-08-11 DIAGNOSIS — I10 Essential (primary) hypertension: Secondary | ICD-10-CM

## 2014-08-11 NOTE — Patient Instructions (Signed)
Medication Instructions:  Same. Stop taking Aspirin In July of 2016 but continue to take Plavix.   Labwork: None  Testing/Procedures: None  Follow-Up: Your physician wants you to follow-up in: 6 months. You will receive a reminder letter in the mail two months in advance. If you don't receive a letter, please call our office to schedule the follow-up appointment.

## 2014-08-11 NOTE — Progress Notes (Signed)
Patient ID: Kellie Bennett, female   DOB: May 13, 1940, 74 y.o.   MRN: [id number] Patient ID: Kellie Bennett, female   DOB: 1941-03-07, 74 y.o.   MRN: [id number]    [place], [address] [place], [place]  [address] Phone: [phone] Fax:  [phone]  Date:  08/11/2014   ID:  Kellie Bennett, DOB [birth year], MRN [id number]  PCP:  Donnajean Lopes, MD      History of Present Illness: Kellie Bennett is a 74 y.o. female who had an inferior MI in 6/15.  She had drug eluting stents to the posterolateral and ostial RCA. She also had PAF at that time.  She has felt well since that time. She has not had the same type of chest pain that she had with MI.  She has been walking regularly and has no problems with walking when the weather is good.  She does a lot of work around the house as well as her husband is somewhat debilitated.  He was recently hospitalized for seizures due to hypoglycemia.  She has had many issues with the cost of medicines of late.  Plavix is more affordable than Brilinta.    On fall after tripping, no syncope.  No blood in stool or signs of internal bleeding.  BP not checked at home.    Wt Readings from Last 3 Encounters:  08/11/14 171 lb (77.565 kg)  03/05/14 167 lb (75.751 kg)  02/04/14 170 lb 11.2 oz (77.429 kg)     Past Medical History  Diagnosis Date  . History of chicken pox   . History of blood clots   . History of blood transfusion   . Breast cancer   . GERD (gastroesophageal reflux disease)   . Hypertension   . Crohn's colitis   . Celiac disease   . Acute myocardial infarction of inferoposterior wall, initial episode of care     Current Outpatient Prescriptions  Medication Sig Dispense Refill  . amLODipine (NORVASC) 5 MG tablet Take 1 tablet (5 mg total) by mouth daily. 90 tablet 3  . aspirin (BAYER LOW DOSE) 81 MG EC tablet Take 81 mg by mouth daily. Swallow whole.    Marland Kitchen atorvastatin (LIPITOR) 40 MG tablet Take 1 tablet (40 mg total) by mouth daily at 6 PM.  90 tablet 3  . Biotin 5000 MCG CAPS Take by mouth.    . cetirizine (ZYRTEC) 1 MG/ML syrup Take 10 mg by mouth daily.    . clopidogrel (PLAVIX) 75 MG tablet Take 1 tablet (75 mg total) by mouth daily. 90 tablet 3  . irbesartan (AVAPRO) 150 MG tablet Take 1 tablet (150 mg total) by mouth daily. 90 tablet 3  . levothyroxine (SYNTHROID, LEVOTHROID) 50 MCG tablet Take 1 tablet (50 mcg total) by mouth daily before breakfast. 30 tablet 3  . loperamide (IMODIUM) 2 MG capsule Take 4 mg by mouth daily.     . metoprolol succinate (TOPROL-XL) 25 MG 24 hr tablet Take 25 mg by mouth 2 (two) times daily.    . nitroGLYCERIN (NITROSTAT) 0.4 MG SL tablet Place 1 tablet (0.4 mg total) under the tongue every 5 (five) minutes as needed for chest pain (CP or SOB). 25 tablet 2  . Omega-3 Fatty Acids (FISH OIL) 1000 MG CAPS Take 2 capules (2000 mg) by mouth once daily  0  . pantoprazole (PROTONIX) 40 MG tablet Take 1 tablet (40 mg total) by mouth daily. 90 tablet 3  . sulfaSALAzine (AZULFIDINE)  500 MG tablet Take 500 mg by mouth 4 (four) times daily.     . vitamin B-12 (CYANOCOBALAMIN) 1000 MCG tablet Take 1,000 mcg by mouth daily.    . Vitamin D, Ergocalciferol, (DRISDOL) 50000 UNITS CAPS capsule Take 50,000 Units by mouth every 7 (seven) days.     No current facility-administered medications for this visit.    Allergies:    Allergies  Allergen Reactions  . Codeine Nausea And Vomiting  . Meperidine Hcl Nausea And Vomiting    Demerol    Social History:  The patient  reports that she has never smoked. She does not have any smokeless tobacco history on file. She reports that she drinks alcohol. She reports that she does not use illicit drugs.   Family History:  The patient's family history includes Alzheimer's disease in her father; Cancer in her maternal grandmother and mother; Heart attack in her son; Hyperlipidemia in her mother; Hypertension in her daughter, father, other, and son; Stroke in her maternal  grandfather and son; Thyroid disease in her maternal grandmother and son.   ROS:  Please see the history of present illness.  No nausea, vomiting.  No fevers, chills.  No focal weakness.  No dysuria.    All other systems reviewed and negative.   PHYSICAL EXAM: VS:  BP 142/76 mmHg  Pulse 64  Ht 5\' 1"  (1.549 m)  Wt 171 lb (77.565 kg)  BMI 32.33 kg/m2 Well nourished, well developed, in no acute distress HEENT: normal Neck: no JVD, no carotid bruits Cardiac:  normal S1, S2; RRR;  Lungs:  clear to auscultation bilaterally, no wheezing, rhonchi or rales Abd: soft, nontender, no hepatomegaly Ext: no edema; 2+ right radial Skin: warm and dry Neuro:   no focal abnormalities noted Psych: normal affect     ASSESSMENT AND PLAN:  1. CAD/ Inferior MI: Changed Brilinta to Plavix 75 mg daily for cost savings.  She would like to stop aspirin.  In July 2016, stop baby aspirin and continue Plavix. 2. HTN: Azor was expensive for her.  Changed to Amlodipine 5 mg daily.  Changed olmesartan to irbesartan 150 mg daily.   She cannot check the left arm due to prior mastectomy.  BP on recheck in the office today is better 120 /65.  Check BP at home.  3. Hyperlipidemia: LDL 73 in September 2015.  Atorvastatin is expensive for her. With lifestyle modifications and weight loss, will see if her LDL will be controlled on atorva 40 mg daily. 4. Edema: Occurred prior to MI but none since.   5.  GERD: Change prilosec to protonix due to possible interaction with Plavix.  Sx controlled.  6.  PAF: at the time of the MI.  No fluttering since that time. No anticoagulation at this time.   Signed, Mina Marble, MD, [facility] 08/11/2014 3:17 PM

## 2014-12-01 ENCOUNTER — Telehealth: Payer: Self-pay | Admitting: Interventional Cardiology

## 2014-12-01 NOTE — Telephone Encounter (Signed)
Error Pt was calling for her husband.// Will delete this message

## 2015-03-02 ENCOUNTER — Ambulatory Visit (INDEPENDENT_AMBULATORY_CARE_PROVIDER_SITE_OTHER): Payer: Medicare Other | Admitting: Interventional Cardiology

## 2015-03-02 ENCOUNTER — Encounter: Payer: Self-pay | Admitting: Interventional Cardiology

## 2015-03-02 VITALS — BP 140/78 | HR 54 | Ht 65.0 in | Wt 166.0 lb

## 2015-03-02 DIAGNOSIS — I10 Essential (primary) hypertension: Secondary | ICD-10-CM | POA: Diagnosis not present

## 2015-03-02 DIAGNOSIS — E785 Hyperlipidemia, unspecified: Secondary | ICD-10-CM

## 2015-03-02 DIAGNOSIS — I252 Old myocardial infarction: Secondary | ICD-10-CM

## 2015-03-02 DIAGNOSIS — I480 Paroxysmal atrial fibrillation: Secondary | ICD-10-CM

## 2015-03-02 DIAGNOSIS — I2510 Atherosclerotic heart disease of native coronary artery without angina pectoris: Secondary | ICD-10-CM | POA: Diagnosis not present

## 2015-03-02 MED ORDER — AMLODIPINE BESYLATE 10 MG PO TABS: 10.0000 mg | ORAL_TABLET | Freq: Every day | ORAL | Status: DC

## 2015-03-02 NOTE — Progress Notes (Signed)
Patient ID: Kellie Bennett, female   DOB: 1940/10/21, 74 y.o.   MRN: [id number]     Cardiology Office Note   Date:  03/02/2015   ID:  Kellie Bennett, DOB Apr 08, 1940, MRN [id number]  PCP:  Donnajean Lopes, MD    No chief complaint on file. f/u CAD/MI   Wt Readings from Last 3 Encounters:  03/02/15 166 lb (75.297 kg)  08/11/14 171 lb (77.565 kg)  03/05/14 167 lb (75.751 kg)       History of Present Illness: Kellie Bennett is a 74 y.o. female  who had an inferior MI in 6/15. She had drug eluting stents to the posterolateral and ostial RCA. She also had PAF at that time. She has felt well since that time. She has not had the same type of chest pain that she had with MI. She has been walking regularly and has no problems with walking when the weather is good. She does a lot of work around the house as well as her husband is quite debilitated, which is stressful for her.  Trying to eat less.   No bleeding problems.   BP not checked at home.   Past Medical History  Diagnosis Date  . History of chicken pox   . History of blood clots   . History of blood transfusion   . Breast cancer ([place])   . GERD (gastroesophageal reflux disease)   . Hypertension   . Crohn's colitis ([place])   . Celiac disease   . Acute myocardial infarction of inferoposterior wall, initial episode of care [facility])     Past Surgical History  Procedure Laterality Date  . Cholecystectomy  2007  . Breast surgery  1987    Breast Biopsy   . Dilation and curettage of uterus    . Left heart catheterization with coronary angiogram N/A 09/27/2013    Procedure: LEFT HEART CATHETERIZATION WITH CORONARY ANGIOGRAM;  Surgeon: Jettie Booze, MD;  Location: [facility] CATH LAB;  Service: Cardiovascular;  Laterality: N/A;  . Percutaneous coronary stent intervention (pci-s)  09/27/2013    Procedure: PERCUTANEOUS CORONARY STENT INTERVENTION (PCI-S);  Surgeon: Jettie Booze, MD;  Location: [facility] CATH LAB;  Service: Cardiovascular;;      Current Outpatient Prescriptions  Medication Sig Dispense Refill  . amLODipine (NORVASC) 5 MG tablet Take 1 tablet (5 mg total) by mouth daily. 90 tablet 3  . atorvastatin (LIPITOR) 40 MG tablet Take 1 tablet (40 mg total) by mouth daily at 6 PM. 90 tablet 3  . Biotin 5000 MCG CAPS Take by mouth.    . cetirizine (ZYRTEC) 1 MG/ML syrup Take 10 mg by mouth daily.    . clopidogrel (PLAVIX) 75 MG tablet Take 1 tablet (75 mg total) by mouth daily. 90 tablet 3  . irbesartan (AVAPRO) 150 MG tablet Take 1 tablet (150 mg total) by mouth daily. 90 tablet 3  . levothyroxine (SYNTHROID, LEVOTHROID) 50 MCG tablet Take 1 tablet (50 mcg total) by mouth daily before breakfast. 30 tablet 3  . loperamide (IMODIUM) 2 MG capsule Take 4 mg by mouth daily.     . metoprolol succinate (TOPROL-XL) 25 MG 24 hr tablet Take 25 mg by mouth 2 (two) times daily.    . nitroGLYCERIN (NITROSTAT) 0.4 MG SL tablet Place 1 tablet (0.4 mg total) under the tongue every 5 (five) minutes as needed for chest pain (CP or SOB). 25 tablet 2  . Omega-3 Fatty Acids (FISH OIL) 1000 MG CAPS Take 2 capules (  2000 mg) by mouth once daily  0  . pantoprazole (PROTONIX) 40 MG tablet Take 1 tablet (40 mg total) by mouth daily. 90 tablet 3  . sulfaSALAzine (AZULFIDINE) 500 MG tablet Take 500 mg by mouth 4 (four) times daily.     . vitamin B-12 (CYANOCOBALAMIN) 1000 MCG tablet Take 1,000 mcg by mouth daily.    . Vitamin D, Ergocalciferol, (DRISDOL) 50000 UNITS CAPS capsule Take 50,000 Units by mouth every 7 (seven) days.     No current facility-administered medications for this visit.    Allergies:   Codeine and Meperidine hcl    Social History:  The patient  reports that she has never smoked. She does not have any smokeless tobacco history on file. She reports that she drinks alcohol. She reports that she does not use illicit drugs.   Family History:  The patient's family history includes Alzheimer's disease in her father; Cancer in her  maternal grandmother and mother; Heart attack in her son; Hyperlipidemia in her mother; Hypertension in her daughter, father, other, and son; Stroke in her maternal grandfather and son; Thyroid disease in her maternal grandmother and son.    ROS:  Please see the history of present illness.   Otherwise, review of systems are positive for joint pains after heavy housework.  No bleeding problems.   All other systems are reviewed and negative.    PHYSICAL EXAM: VS:  BP 140/78 mmHg  Pulse 54  Ht 5\' 5"  (1.651 m)  Wt 166 lb (75.297 kg)  BMI 27.62 kg/m2 , BMI Body mass index is 27.62 kg/(m^2). GEN: Well nourished, well developed, in no acute distress HEENT: normal Neck: no JVD, carotid bruits, or masses Cardiac: RRR; no murmurs, rubs, or gallops,no edema  Respiratory:  clear to auscultation bilaterally, normal work of breathing GI: soft, nontender, nondistended, + BS MS: no deformity or atrophy Skin: warm and dry, no rash Neuro:  Strength and sensation are intact Psych: euthymic mood, full affect   EKG:   The ekg ordered today demonstrates NSR, nonspecific ST changes   Recent Labs: 03/05/2014: TSH 2.47 03/13/2014: BUN 16; Creatinine, Ser 1.3*; Hemoglobin 11.3*; Platelets 242.0; Potassium 3.6; Sodium 135 05/06/2014: ALT 9   Lipid Panel    Component Value Date/Time   CHOL 157 05/06/2014 1021   TRIG 183.0* 05/06/2014 1021   HDL 37.60* 05/06/2014 1021   CHOLHDL 4 05/06/2014 1021   VLDL 36.6 05/06/2014 1021   LDLCALC 83 05/06/2014 1021     Other studies Reviewed: Additional studies/ records that were reviewed today with results demonstrating: .   ASSESSMENT AND PLAN:  1. CAD/ Inferior MI: Changed Brilinta to Plavix 75 mg daily for cost savings. In July 2016, stopped baby aspirin and continue Plavix. No angina.  DES in the RCA.   2. HTN: Azor was expensive for her. Changed to Amlodipine 5 mg daily. Changed olmesartan to irbesartan 150 mg daily. She cannot check the left arm due  to prior mastectomy. BP on recheck in the office today is better 150/62. Check BP at home, but she does not have a cuff.  Increase amlodipine to 10 mg daily.  She will get a repeat BP check at Dr. Shon Baton office, since there is no copay there. PAF: at the time of the MI. No fluttering since that time. No anticoagulation at this time.  Hyperlipidemia: LDL 73 in September 2015.With lifestyle modifications and weight loss,LDL controlled on atorva 40 mg daily, when checked in 2/16.   Current medicines are  reviewed at length with the patient today.  The patient concerns regarding her medicines were addressed.  The following changes have been made:  Increase amlodipine  Labs/ tests ordered today include:  No orders of the defined types were placed in this encounter.    Recommend 150 minutes/week of aerobic exercise Low fat, low carb, high fiber diet recommended  Disposition:   FU in 1 year   Teresita Madura., MD  03/02/2015 9:13 AM    [facility] [place], [place], [place]  [address] Phone: [phone]; Fax: [phone]

## 2015-03-02 NOTE — Patient Instructions (Signed)
Medication Instructions:  Increase Amlodipine to 10 mg daily-all other medications remain the same  Labwork: None  Testing/Procedures: None  Follow-Up: Your physician wants you to follow-up in: 1 year. You will receive a reminder letter in the mail two months in advance. If you don't receive a letter, please call our office to schedule the follow-up appointment.   Any Other Special Instructions Will Be Listed Below (If Applicable). Please have your blood pressure checked by Dr Sharlett Iles in 2 to 4 weeks.     If you need a refill on your cardiac medications before your next appointment, please call your pharmacy.

## 2015-03-19 ENCOUNTER — Other Ambulatory Visit: Payer: Self-pay | Admitting: Interventional Cardiology

## 2015-10-05 ENCOUNTER — Telehealth: Payer: Self-pay | Admitting: Interventional Cardiology

## 2015-10-05 DIAGNOSIS — I10 Essential (primary) hypertension: Secondary | ICD-10-CM

## 2015-10-05 DIAGNOSIS — R0602 Shortness of breath: Secondary | ICD-10-CM

## 2015-10-05 NOTE — Telephone Encounter (Signed)
Check BMet and BNP. Any opening with APP?

## 2015-10-05 NOTE — Telephone Encounter (Signed)
Pt c/o Shortness Of Breath: STAT if SOB developed within the last 24 hours or pt is noticeably SOB on the phone  1. Are you currently SOB (can you hear that pt is SOB on the phone)? no  2. How long have you been experiencing SOB? Fri 6/30  3. Are you SOB when sitting or when up moving around? Moving around   4. Are you currently experiencing any other symptoms? no

## 2015-10-05 NOTE — Telephone Encounter (Signed)
Spoke with pt and informed her of recommendations per Dr. Irish Lack. Scheduled pt to see Cecilie Kicks, NP on 7/5 and pt will have labs drawn at that time. Pt verbalized understanding and was in agreement with this plan.

## 2015-10-05 NOTE — Telephone Encounter (Signed)
Left message to call back  

## 2015-10-05 NOTE — Telephone Encounter (Signed)
Patient called to report she has had shortness of breath on exertion since 6/30. She has no CP, indigestion, swelling.  She has no VS to report. The patient is not audibly SOB on the phone. She states, "I do not think this is an emergency, but I would like Dr. Irish Lack to check me out." The patient understands Dr. Irish Lack is not in the office today. She awaits his recommendations and understands his nurse will call with an appropriate appointment. Instructed her to take it easy for now and to seek medical attention if breathing worsens in the meantime. The patient was grateful for call.

## 2015-10-06 NOTE — Progress Notes (Addendum)
Cardiology Office Note   Date:  10/07/2015   ID:  MYRAKLE SALASAR, DOB 1940/05/11, MRN [id number]  PCP:  Donnajean Lopes, MD  Cardiologist:  Dr. Irish Lack   Chief Complaint  Patient presents with  . Shortness of Breath      History of Present Illness: Kellie Bennett is a 75 y.o. female who presents for DOE.  Called on the 3rd with SOB that started on 10/02/15.    She has a history of an inferior MI in 6/15. She had drug eluting stents to the posterolateral and ostial RCA. She also had PAF at that time. She has felt well since that time. She has not had the same type of chest pain that she had with MI. She has been walking regularly and has no problems with walking when the weather is good. She does a lot of work around the house as well as her husband is quite debilitated, which is stressful for her.  Today she is anxious worried that she may have a heart attack. She is DOE, only with exertion, which is similar to when she had MI.  She also had chest discomfort then which she thought was indigestion.  No changes on EKG and no complaints of palpitations.  She cares for her husband and is also have anxiety.  She believes the anxiety is a big part of the problem.  She has xanax but it knocks her out.  We discussed taking a forth of the pill but she is reluctant. I asked her to call Dr. Sharlett Iles for another med. SSRI or Buspar something that will not cause as much fatigue.  She has no one to help with her husband.      Past Medical History  Diagnosis Date  . History of chicken pox   . History of blood clots   . History of blood transfusion   . Breast cancer ([place])   . GERD (gastroesophageal reflux disease)   . Hypertension   . Crohn's colitis ([place])   . Celiac disease   . Acute myocardial infarction of inferoposterior wall, initial episode of care [facility])     Past Surgical History  Procedure Laterality Date  . Cholecystectomy  2007  . Breast surgery  1987    Breast Biopsy   .  Dilation and curettage of uterus    . Left heart catheterization with coronary angiogram N/A 09/27/2013    Procedure: LEFT HEART CATHETERIZATION WITH CORONARY ANGIOGRAM;  Surgeon: Jettie Booze, MD;  Location: [facility] CATH LAB;  Service: Cardiovascular;  Laterality: N/A;  . Percutaneous coronary stent intervention (pci-s)  09/27/2013    Procedure: PERCUTANEOUS CORONARY STENT INTERVENTION (PCI-S);  Surgeon: Jettie Booze, MD;  Location: [facility] CATH LAB;  Service: Cardiovascular;;     Current Outpatient Prescriptions  Medication Sig Dispense Refill  . amLODipine (NORVASC) 10 MG tablet Take 1 tablet (10 mg total) by mouth daily. 90 tablet 3  . atorvastatin (LIPITOR) 40 MG tablet TAKE ONE TABLET BY MOUTH ONCE DAILY AT  6  PM 90 tablet 3  . Biotin 5000 MCG CAPS Take 1 capsule by mouth daily.     . cetirizine (ZYRTEC) 1 MG/ML syrup Take 10 mg by mouth daily.    . clopidogrel (PLAVIX) 75 MG tablet TAKE ONE TABLET BY MOUTH ONCE DAILY 90 tablet 3  . irbesartan (AVAPRO) 150 MG tablet TAKE ONE TABLET BY MOUTH ONCE DAILY 90 tablet 3  . levothyroxine (SYNTHROID, LEVOTHROID) 50 MCG tablet Take 1 tablet (  50 mcg total) by mouth daily before breakfast. 30 tablet 3  . loperamide (IMODIUM) 2 MG capsule Take 4 mg by mouth daily.     . metoprolol succinate (TOPROL-XL) 25 MG 24 hr tablet Take 25 mg by mouth 2 (two) times daily.    . nitroGLYCERIN (NITROSTAT) 0.4 MG SL tablet Place 1 tablet (0.4 mg total) under the tongue every 5 (five) minutes as needed for chest pain (CP or SOB). 25 tablet 2  . Omega-3 Fatty Acids (FISH OIL) 1000 MG CAPS Take 2 capules (2000 mg) by mouth once daily  0  . pantoprazole (PROTONIX) 40 MG tablet TAKE ONE TABLET BY MOUTH ONCE DAILY 90 tablet 3  . sulfaSALAzine (AZULFIDINE) 500 MG tablet Take 500 mg by mouth 4 (four) times daily.     . vitamin B-12 (CYANOCOBALAMIN) 1000 MCG tablet Take 1,000 mcg by mouth daily.    . Vitamin D, Ergocalciferol, (DRISDOL) 50000 UNITS CAPS capsule Take  50,000 Units by mouth every 7 (seven) days.     No current facility-administered medications for this visit.    Allergies:   Codeine and Meperidine hcl    Social History:  The patient  reports that she has never smoked. She does not have any smokeless tobacco history on file. She reports that she drinks alcohol. She reports that she does not use illicit drugs.   Family History:  The patient's family history includes Alzheimer's disease in her father; Cancer in her maternal grandmother and mother; Heart attack in her son; Hyperlipidemia in her mother; Hypertension in her daughter, father, other, and son; Stroke in her maternal grandfather and son; Thyroid disease in her maternal grandmother and son.    ROS:  General:no colds or fevers, no weight changes Skin:no rashes or ulcers HEENT:no blurred vision, no congestion CV:see HPI PUL:see HPI GI:no diarrhea constipation or melena, no indigestion GU:no hematuria, no dysuria MS:no joint pain, no claudication Neuro:no syncope, no lightheadedness Endo:no diabetes, + thyroid disease  Wt Readings from Last 3 Encounters:  10/07/15 171 lb 9.6 oz (77.837 kg)  03/02/15 166 lb (75.297 kg)  08/11/14 171 lb (77.565 kg)     PHYSICAL EXAM: VS:  BP 156/90 mmHg  Pulse 74  Ht 5' (1.524 m)  Wt 171 lb 9.6 oz (77.837 kg)  BMI 33.51 kg/m2  SpO2 98% , BMI Body mass index is 33.51 kg/(m^2). General:Pleasant affect, NAD Skin:Warm and dry, brisk capillary refill HEENT:normocephalic, sclera clear, mucus membranes moist Neck:supple, no JVD, no bruits  Heart:S1S2 RRR without murmur, gallup, rub or click Lungs:clear without rales, rhonchi, or wheezes [id number], non tender, + BS, do not palpate liver spleen or masses Ext:no lower ext edema, 2+ pedal pulses, 2+ radial pulses Neuro:alert and oriented, MAE, follows commands, + facial symmetry    EKG:  EKG is ordered today. The ekg ordered today demonstrates SR with PACs poor R wave progression ant leads.  Similar to previous EKG..   Recent Labs: No results found for requested labs within last 365 days.    Lipid Panel    Component Value Date/Time   CHOL 157 05/06/2014 1021   TRIG 183.0* 05/06/2014 1021   HDL 37.60* 05/06/2014 1021   CHOLHDL 4 05/06/2014 1021   VLDL 36.6 05/06/2014 1021   LDLCALC 83 05/06/2014 1021       Other studies Reviewed: Additional studies/ records that were reviewed today include: previous notes. . Cardiac cath 09/28/13.   1. Widely patent left main coronary artery. 2. Mild to moderate disease left  anterior descending artery. Diagonal disease as noted above in a fairly small caliber vessel. 3. Mild to moderate disease in the left circumflex artery and its branches. 4. Severe disease in the right coronary artery ostium of 80%. The culprit for today's presentation was the 100% occluded posterior lateral artery. The posterior lateral artery was stented with a 2.25 x 30 resolute drug-eluting stent, postdilated to 2.6 mm. The ostial RCA was stented with a 4.0 x 15 resolute drug-eluting stent, postdilated up to 4.4 mm in diameter. There was an excellent angiographic result with no residual stenosis.. 5. Normal left ventricular systolic function. LVEDP 17 mmHg. Ejection fraction 55 %.  ASSESSMENT AND PLAN: 1.  DOE she feels is mostly anxiety but this was part of anginal symptoms with MI, I would like to do [employer]- but with her finances she would prefer the anxiety treated first.   She will call Dr. Sharlett Iles and ask for something today or appt.  I did not wish to order - he knows her better and would follow the meds.  --when she is ready for lexiscan myoview she will call us.  He did discuss echo but I feel we would learn more with the myoview.  2. CAD/ Inferior MI: Changed Brilinta to Plavix 75 mg daily for cost savings. She is off ASA and on plavix alone 3. HTN: Azor was expensive for her. Changed to Amlodipine 5 mg daily. Changed olmesartan to  irbesartan 150 mg daily. She cannot check the left arm due to prior mastectomy. BP on recheck in the office today is elevated but she has not taken her meds today.  She will check her BP when she feels SOB to see if HTN is causing.   Check BP at home.  4. Hyperlipidemia: LDL 73 in September 2015. Atorvastatin is expensive for her. With lifestyle modifications and weight loss, will see if her LDL will be controlled on atorva 40 mg daily. 5.   PAF: at the time of the MI. No fluttering since that time. No anticoagulation at this time.  6. Anxiety- to see/call her PCP's office.   Current medicines are reviewed with the patient today.  The patient Has no concerns regarding medicines.  The following changes have been made:  See above Labs/ tests ordered today include:see above  Disposition:   FU:  see above  Signed, Cecilie Kicks, NP  10/07/2015 12:02 PM    [facility], [place], [place], [place] Phone: [phone]; Fax: [phone]

## 2015-10-07 ENCOUNTER — Encounter (INDEPENDENT_AMBULATORY_CARE_PROVIDER_SITE_OTHER): Payer: Self-pay

## 2015-10-07 ENCOUNTER — Encounter: Payer: Self-pay | Admitting: Cardiology

## 2015-10-07 ENCOUNTER — Ambulatory Visit (INDEPENDENT_AMBULATORY_CARE_PROVIDER_SITE_OTHER): Payer: Medicare Other | Admitting: Cardiology

## 2015-10-07 VITALS — BP 156/90 | HR 74 | Ht 60.0 in | Wt 171.6 lb

## 2015-10-07 DIAGNOSIS — I2510 Atherosclerotic heart disease of native coronary artery without angina pectoris: Secondary | ICD-10-CM

## 2015-10-07 DIAGNOSIS — R0609 Other forms of dyspnea: Secondary | ICD-10-CM | POA: Diagnosis not present

## 2015-10-07 DIAGNOSIS — E785 Hyperlipidemia, unspecified: Secondary | ICD-10-CM | POA: Diagnosis not present

## 2015-10-07 DIAGNOSIS — F419 Anxiety disorder, unspecified: Secondary | ICD-10-CM

## 2015-10-07 DIAGNOSIS — I252 Old myocardial infarction: Secondary | ICD-10-CM | POA: Diagnosis not present

## 2015-10-07 LAB — BRAIN NATRIURETIC PEPTIDE: Brain Natriuretic Peptide: 104.3 pg/mL — ABNORMAL HIGH (ref <100)

## 2015-10-07 LAB — BASIC METABOLIC PANEL
BUN: 17 mg/dL (ref 7–25)
CHLORIDE: 104 mmol/L (ref 98–110)
CO2: 22 mmol/L (ref 20–31)
CREATININE: 1.3 mg/dL — AB (ref 0.60–0.93)
Calcium: 9.7 mg/dL (ref 8.6–10.4)
GLUCOSE: 84 mg/dL (ref 65–99)
Potassium: 4.2 mmol/L (ref 3.5–5.3)
Sodium: 138 mmol/L (ref 135–146)

## 2015-10-07 LAB — TSH: TSH: 3.06 mIU/L

## 2015-10-07 NOTE — Patient Instructions (Addendum)
Medication Instructions:  Your physician recommends that you continue on your current medications as directed. Please refer to the Current Medication list given to you today.   Labwork: TODAY BMET, BNP, TSH  Testing/Procedures: NONE  Follow-Up: 12/16/15 @ 9:45 AM WITH DR. VARANASI  Any Other Special Instructions Will Be Listed Below (If Applicable).     If you need a refill on your cardiac medications before your next appointment, please call your pharmacy.

## 2015-12-01 ENCOUNTER — Encounter: Payer: Self-pay | Admitting: Interventional Cardiology

## 2015-12-16 ENCOUNTER — Ambulatory Visit: Payer: Medicare Other | Admitting: Interventional Cardiology

## 2016-02-19 ENCOUNTER — Ambulatory Visit: Payer: Medicare Other | Admitting: Interventional Cardiology

## 2016-04-12 ENCOUNTER — Other Ambulatory Visit: Payer: Self-pay | Admitting: Interventional Cardiology

## 2016-05-07 ENCOUNTER — Other Ambulatory Visit: Payer: Self-pay | Admitting: Interventional Cardiology

## 2016-05-07 DIAGNOSIS — I10 Essential (primary) hypertension: Secondary | ICD-10-CM

## 2016-05-07 DIAGNOSIS — E785 Hyperlipidemia, unspecified: Secondary | ICD-10-CM

## 2016-11-13 ENCOUNTER — Other Ambulatory Visit: Payer: Self-pay | Admitting: Interventional Cardiology

## 2016-11-13 DIAGNOSIS — I10 Essential (primary) hypertension: Secondary | ICD-10-CM

## 2016-11-13 DIAGNOSIS — E785 Hyperlipidemia, unspecified: Secondary | ICD-10-CM

## 2016-12-18 ENCOUNTER — Other Ambulatory Visit: Payer: Self-pay | Admitting: Interventional Cardiology

## 2016-12-18 DIAGNOSIS — I10 Essential (primary) hypertension: Secondary | ICD-10-CM

## 2016-12-18 DIAGNOSIS — E785 Hyperlipidemia, unspecified: Secondary | ICD-10-CM

## 2017-02-28 ENCOUNTER — Other Ambulatory Visit: Payer: Self-pay | Admitting: Interventional Cardiology

## 2017-02-28 DIAGNOSIS — I10 Essential (primary) hypertension: Secondary | ICD-10-CM

## 2017-02-28 DIAGNOSIS — E785 Hyperlipidemia, unspecified: Secondary | ICD-10-CM

## 2017-03-06 ENCOUNTER — Telehealth: Payer: Self-pay | Admitting: Interventional Cardiology

## 2017-03-06 ENCOUNTER — Other Ambulatory Visit: Payer: Self-pay | Admitting: Interventional Cardiology

## 2017-03-06 DIAGNOSIS — I10 Essential (primary) hypertension: Secondary | ICD-10-CM

## 2017-03-06 DIAGNOSIS — E785 Hyperlipidemia, unspecified: Secondary | ICD-10-CM

## 2017-03-07 NOTE — Telephone Encounter (Signed)
Left message for patient to call back  

## 2017-03-08 NOTE — Telephone Encounter (Signed)
Ok to fill.  SHe should be seen.

## 2017-03-08 NOTE — Telephone Encounter (Signed)
Follow Up: ° ° ° °Returning your call from this morning. °

## 2017-03-08 NOTE — Telephone Encounter (Signed)
Left message for patient to call back. Refilled meds for 30 days.

## 2017-03-08 NOTE — Telephone Encounter (Signed)
Patient called and made aware that a 30 day supply of her plavix and amlodipine have been sent in. Made patient aware that she is overdue for her F/U appointment. Patient requesting to schedule after the first of the year. Scheduled patient for 04/10/17 at 11:40 AM.

## 2017-04-05 NOTE — Progress Notes (Deleted)
Cardiology Office Note   Date:  04/05/2017   ID:  Kellie Bennett, DOB 1941-01-13, MRN [id number]  PCP:  Leanna Battles, MD    No chief complaint on file.    Wt Readings from Last 3 Encounters:  10/07/15 171 lb 9.6 oz (77.8 kg)  03/02/15 166 lb (75.3 kg)  08/11/14 171 lb (77.6 kg)       History of Present Illness: Kellie Bennett is a 77 y.o. female  has a history of an inferior MI in 6/15. She had drug eluting stents to the posterolateral and ostial RCA. She also had PAF at that time.  She was seen for anxiety/SHOB in 2017.  She was working with her PMD for anxiety treatment.  Stress test was considered but deferred for anxiety treatment, due to finances, according to the note.    Past Medical History:  Diagnosis Date  . Acute myocardial infarction of inferoposterior wall, initial episode of care ([place])   . Breast cancer ([place])   . Celiac disease   . Crohn's colitis ([place])   . GERD (gastroesophageal reflux disease)   . History of blood clots   . History of blood transfusion   . History of chicken pox   . Hypertension     Past Surgical History:  Procedure Laterality Date  . BREAST SURGERY  1987   Breast Biopsy   . CHOLECYSTECTOMY  2007  . DILATION AND CURETTAGE OF UTERUS    . LEFT HEART CATHETERIZATION WITH CORONARY ANGIOGRAM N/A 09/27/2013   Procedure: LEFT HEART CATHETERIZATION WITH CORONARY ANGIOGRAM;  Surgeon: Jettie Booze, MD;  Location: [facility] CATH LAB;  Service: Cardiovascular;  Laterality: N/A;  . PERCUTANEOUS CORONARY STENT INTERVENTION (PCI-S)  09/27/2013   Procedure: PERCUTANEOUS CORONARY STENT INTERVENTION (PCI-S);  Surgeon: Jettie Booze, MD;  Location: [facility] CATH LAB;  Service: Cardiovascular;;     Current Outpatient Medications  Medication Sig Dispense Refill  . amLODipine (NORVASC) 10 MG tablet Take 1 tablet (10 mg total) by mouth daily. Need appointment for further refills. 30 tablet 0  . atorvastatin (LIPITOR) 40 MG tablet TAKE ONE TABLET BY  MOUTH ONCE DAILY AT 6PM 90 tablet 1  . Biotin 5000 MCG CAPS Take 1 capsule by mouth daily.     . cetirizine (ZYRTEC) 1 MG/ML syrup Take 10 mg by mouth daily.    . clopidogrel (PLAVIX) 75 MG tablet Take 1 tablet (75 mg total) by mouth daily. Need appointment for further refills. 30 tablet 0  . irbesartan (AVAPRO) 150 MG tablet TAKE ONE TABLET BY MOUTH ONCE DAILY 90 tablet 1  . levothyroxine (SYNTHROID, LEVOTHROID) 50 MCG tablet Take 1 tablet (50 mcg total) by mouth daily before breakfast. 30 tablet 3  . loperamide (IMODIUM) 2 MG capsule Take 4 mg by mouth daily.     . metoprolol succinate (TOPROL-XL) 25 MG 24 hr tablet Take 25 mg by mouth 2 (two) times daily.    . nitroGLYCERIN (NITROSTAT) 0.4 MG SL tablet Place 1 tablet (0.4 mg total) under the tongue every 5 (five) minutes as needed for chest pain (CP or SOB). 25 tablet 2  . Omega-3 Fatty Acids (FISH OIL) 1000 MG CAPS Take 2 capules (2000 mg) by mouth once daily  0  . pantoprazole (PROTONIX) 40 MG tablet TAKE ONE TABLET BY MOUTH ONCE DAILY 90 tablet 1  . sulfaSALAzine (AZULFIDINE) 500 MG tablet Take 500 mg by mouth 4 (four) times daily.     . vitamin B-12 (CYANOCOBALAMIN) 1000  MCG tablet Take 1,000 mcg by mouth daily.    . Vitamin D, Ergocalciferol, (DRISDOL) 50000 UNITS CAPS capsule Take 50,000 Units by mouth every 7 (seven) days.     No current facility-administered medications for this visit.     Allergies:   Codeine and Meperidine hcl    Social History:  The patient  reports that  has never smoked. she has never used smokeless tobacco. She reports that she drinks alcohol. She reports that she does not use drugs.   Family History:  The patient's ***family history includes Alzheimer's disease in her father; Cancer in her maternal grandmother and mother; Heart attack in her son; Hyperlipidemia in her mother; Hypertension in her daughter, father, other, and son; Stroke in her maternal grandfather and son; Thyroid disease in her maternal  grandmother and son.    ROS:  Please see the history of present illness.   Otherwise, review of systems are positive for ***.   All other systems are reviewed and negative.    PHYSICAL EXAM: VS:  There were no vitals taken for this visit. , BMI There is no height or weight on file to calculate BMI. GEN: Well nourished, well developed, in no acute distress  HEENT: normal  Neck: no JVD, carotid bruits, or masses Cardiac: ***RRR; no murmurs, rubs, or gallops,no edema  Respiratory:  clear to auscultation bilaterally, normal work of breathing GI: soft, nontender, nondistended, + BS MS: no deformity or atrophy  Skin: warm and dry, no rash Neuro:  Strength and sensation are intact Psych: euthymic mood, full affect   EKG:   The ekg ordered today demonstrates ***   Recent Labs: No results found for requested labs within last 8760 hours.   Lipid Panel    Component Value Date/Time   CHOL 157 05/06/2014 1021   TRIG 183.0 (H) 05/06/2014 1021   HDL 37.60 (L) 05/06/2014 1021   CHOLHDL 4 05/06/2014 1021   VLDL 36.6 05/06/2014 1021   LDLCALC 83 05/06/2014 1021     Other studies Reviewed: Additional studies/ records that were reviewed today with results demonstrating: ***.   ASSESSMENT AND PLAN:  1. CAD/Old MI:  2. HTN: 3. Hyperlipidemia: 4. PAF: Noted at the time of the MI.    Current medicines are reviewed at length with the patient today.  The patient concerns regarding her medicines were addressed.  The following changes have been made:  No change***  Labs/ tests ordered today include: *** No orders of the defined types were placed in this encounter.   Recommend 150 minutes/week of aerobic exercise Low fat, low carb, high fiber diet recommended  Disposition:   FU in ***   Signed, Larae Grooms, MD  04/05/2017 3:35 PM    [facility] [place], [place], [place]  [address] Phone: [phone]; Fax: [phone]

## 2017-04-07 DIAGNOSIS — E038 Other specified hypothyroidism: Secondary | ICD-10-CM | POA: Diagnosis not present

## 2017-04-07 DIAGNOSIS — E7849 Other hyperlipidemia: Secondary | ICD-10-CM | POA: Diagnosis not present

## 2017-04-07 DIAGNOSIS — M859 Disorder of bone density and structure, unspecified: Secondary | ICD-10-CM | POA: Diagnosis not present

## 2017-04-07 DIAGNOSIS — I10 Essential (primary) hypertension: Secondary | ICD-10-CM | POA: Diagnosis not present

## 2017-04-10 ENCOUNTER — Ambulatory Visit: Payer: Medicare Other | Admitting: Interventional Cardiology

## 2017-04-12 DIAGNOSIS — C50912 Malignant neoplasm of unspecified site of left female breast: Secondary | ICD-10-CM | POA: Diagnosis not present

## 2017-04-14 DIAGNOSIS — I2510 Atherosclerotic heart disease of native coronary artery without angina pectoris: Secondary | ICD-10-CM | POA: Diagnosis not present

## 2017-04-14 DIAGNOSIS — Z0000 Encounter for general adult medical examination without abnormal findings: Secondary | ICD-10-CM | POA: Diagnosis not present

## 2017-04-14 DIAGNOSIS — I10 Essential (primary) hypertension: Secondary | ICD-10-CM | POA: Diagnosis not present

## 2017-04-14 DIAGNOSIS — Z6831 Body mass index (BMI) 31.0-31.9, adult: Secondary | ICD-10-CM | POA: Diagnosis not present

## 2017-04-14 DIAGNOSIS — Z1389 Encounter for screening for other disorder: Secondary | ICD-10-CM | POA: Diagnosis not present

## 2017-04-14 DIAGNOSIS — E7849 Other hyperlipidemia: Secondary | ICD-10-CM | POA: Diagnosis not present

## 2017-04-14 DIAGNOSIS — K5090 Crohn's disease, unspecified, without complications: Secondary | ICD-10-CM | POA: Diagnosis not present

## 2017-04-14 DIAGNOSIS — E038 Other specified hypothyroidism: Secondary | ICD-10-CM | POA: Diagnosis not present

## 2017-04-14 DIAGNOSIS — M859 Disorder of bone density and structure, unspecified: Secondary | ICD-10-CM | POA: Diagnosis not present

## 2017-04-25 ENCOUNTER — Ambulatory Visit: Payer: Self-pay | Admitting: Interventional Cardiology

## 2017-05-10 DIAGNOSIS — C50912 Malignant neoplasm of unspecified site of left female breast: Secondary | ICD-10-CM | POA: Diagnosis not present

## 2017-09-27 ENCOUNTER — Encounter: Payer: Self-pay | Admitting: Endocrinology

## 2017-09-27 ENCOUNTER — Ambulatory Visit: Payer: Self-pay | Admitting: Endocrinology

## 2017-09-27 VITALS — BP 142/78 | HR 59 | Ht 61.0 in | Wt 160.4 lb

## 2017-09-27 DIAGNOSIS — K5010 Crohn's disease of large intestine without complications: Secondary | ICD-10-CM

## 2017-09-27 DIAGNOSIS — E042 Nontoxic multinodular goiter: Secondary | ICD-10-CM

## 2017-09-27 DIAGNOSIS — R1310 Dysphagia, unspecified: Secondary | ICD-10-CM

## 2017-09-27 DIAGNOSIS — K900 Celiac disease: Secondary | ICD-10-CM

## 2017-09-27 DIAGNOSIS — E039 Hypothyroidism, unspecified: Secondary | ICD-10-CM

## 2017-09-27 NOTE — Patient Instructions (Signed)
blood tests are requested for you today.  We'll let you know about the results.  Let's recheck the ultrasound.  you will receive a phone call, about a day and time for an appointment.  Please see a specialist for the swallowing.  you will receive a phone call, about a day and time for an appointment.   Please come back for a follow-up appointment in 1 year.

## 2017-09-27 NOTE — Progress Notes (Signed)
Subjective:    Patient ID: Kellie Bennett, female    DOB: [birth year], 77 y.o.   MRN: [id number]  HPI Pt is referred by Dr Philip Aspen, for hypothyroidism, and multinodular goiter.  Pt reports hypothyroidism was dx'ed in the 1970's. she then took synthroid x a few years; since then, she says she was told that the thyroid was back to normal, off any supplement; in 2013, she was restarted on synthroid, after a TSH was elevated; also in 2013, she had an ultrasound showing multinodular goiter; she had bx, and cytology showed atypical follicular lesion with small lymphocytes.  He has never taken kelp or any other type of non-prescribed thyroid product.  He has never had thyroid surgery, or XRT to the neck.  He has never been on amiodarone or lithium.  Pt states 1 year of solid>liquid dysphagia, but no assoc pain.  Son has hyperparathyroidism, so pt requests check of this.   Past Medical History:  Diagnosis Date  . Acute myocardial infarction of inferoposterior wall, initial episode of care ([place])   . Breast cancer ([place])   . Celiac disease   . Crohn's colitis ([place])   . GERD (gastroesophageal reflux disease)   . History of blood clots   . History of blood transfusion   . History of chicken pox   . Hypertension     Past Surgical History:  Procedure Laterality Date  . BREAST SURGERY  1987   Breast Biopsy   . CHOLECYSTECTOMY  2007  . DILATION AND CURETTAGE OF UTERUS    . LEFT HEART CATHETERIZATION WITH CORONARY ANGIOGRAM N/A 09/27/2013   Procedure: LEFT HEART CATHETERIZATION WITH CORONARY ANGIOGRAM;  Surgeon: Jettie Booze, MD;  Location: [facility] CATH LAB;  Service: Cardiovascular;  Laterality: N/A;  . PERCUTANEOUS CORONARY STENT INTERVENTION (PCI-S)  09/27/2013   Procedure: PERCUTANEOUS CORONARY STENT INTERVENTION (PCI-S);  Surgeon: Jettie Booze, MD;  Location: [facility] CATH LAB;  Service: Cardiovascular;;    Social History   Socioeconomic History  . Marital status: Married    Spouse name: Not  on file  . Number of children: 6  . Years of education: 58  . Highest education level: Not on file  Occupational History  . Occupation: Retired  Scientific laboratory technician  . Financial resource strain: Not on file  . Food insecurity:    Worry: Not on file    Inability: Not on file  . Transportation needs:    Medical: Not on file    Non-medical: Not on file  Tobacco Use  . Smoking status: Never Smoker  . Smokeless tobacco: Never Used  Substance and Sexual Activity  . Alcohol use: Yes    Comment: occasional  . Drug use: No  . Sexual activity: Not on file  Lifestyle  . Physical activity:    Days per week: Not on file    Minutes per session: Not on file  . Stress: Not on file  Relationships  . Social connections:    Talks on phone: Not on file    Gets together: Not on file    Attends religious service: Not on file    Active member of club or organization: Not on file    Attends meetings of clubs or organizations: Not on file    Relationship status: Not on file  . Intimate partner violence:    Fear of current or ex partner: Not on file    Emotionally abused: Not on file    Physically abused: Not on file  Forced sexual activity: Not on file  Other Topics Concern  . Not on file  Social History Narrative   Regular exercise-no   Caffeine Use-yes    Current Outpatient Medications on File Prior to Visit  Medication Sig Dispense Refill  . amLODipine (NORVASC) 10 MG tablet Take 1 tablet (10 mg total) by mouth daily. Need appointment for further refills. 30 tablet 0  . atorvastatin (LIPITOR) 40 MG tablet TAKE ONE TABLET BY MOUTH ONCE DAILY AT 6PM 90 tablet 1  . Biotin 5000 MCG CAPS Take 1 capsule by mouth daily.     . cetirizine (ZYRTEC) 1 MG/ML syrup Take 10 mg by mouth daily.    . clopidogrel (PLAVIX) 75 MG tablet Take 1 tablet (75 mg total) by mouth daily. Need appointment for further refills. 30 tablet 0  . irbesartan (AVAPRO) 150 MG tablet TAKE ONE TABLET BY MOUTH ONCE DAILY 90  tablet 1  . levothyroxine (SYNTHROID, LEVOTHROID) 50 MCG tablet Take 1 tablet (50 mcg total) by mouth daily before breakfast. 30 tablet 3  . loperamide (IMODIUM) 2 MG capsule Take 4 mg by mouth daily.     . metoprolol succinate (TOPROL-XL) 25 MG 24 hr tablet Take 25 mg by mouth 2 (two) times daily.    . nitroGLYCERIN (NITROSTAT) 0.4 MG SL tablet Place 1 tablet (0.4 mg total) under the tongue every 5 (five) minutes as needed for chest pain (CP or SOB). 25 tablet 2  . Omega-3 Fatty Acids (FISH OIL) 1000 MG CAPS Take 2 capules (2000 mg) by mouth once daily  0  . pantoprazole (PROTONIX) 40 MG tablet TAKE ONE TABLET BY MOUTH ONCE DAILY 90 tablet 1  . sulfaSALAzine (AZULFIDINE) 500 MG tablet Take 500 mg by mouth 4 (four) times daily.     . vitamin B-12 (CYANOCOBALAMIN) 1000 MCG tablet Take 1,000 mcg by mouth daily.    . Vitamin D, Ergocalciferol, (DRISDOL) 50000 UNITS CAPS capsule Take 50,000 Units by mouth every 7 (seven) days.     No current facility-administered medications on file prior to visit.     Allergies  Allergen Reactions  . Codeine Nausea And Vomiting  . Meperidine Hcl Nausea And Vomiting    Demerol    Family History  Problem Relation Age of Onset  . Cancer Mother   . Hyperlipidemia Mother   . Alzheimer's disease Father   . Hypertension Father   . Cancer Maternal Grandmother        Thyroid Cancer  . Thyroid disease Maternal Grandmother   . Stroke Maternal Grandfather   . Hypertension Other        Parent  . Stroke Son   . Hypertension Son   . Hypertension Daughter   . Heart attack Son   . Thyroid disease Son     BP (!) 142/78 (BP Location: Right Arm, Patient Position: Sitting, Cuff Size: Normal)   Pulse (!) 59   Ht 5\' 1"  (1.549 m)   Wt 160 lb 6.4 oz (72.8 kg)   SpO2 97%   BMI 30.31 kg/m    Review of Systems denies depression, muscle cramps, sob, weight gain, memory loss, constipation, numbness, blurry vision, cold intolerance, myalgias, dry skin, rhinorrhea,  easy bruising, and syncope.  She has intermitt hoarseness and headache. She has hair loss (tales biotin)    Objective:   Physical Exam VS: see vs page GEN: no distress HEAD: head: no deformity eyes: no periorbital swelling, no proptosis external nose and ears are normal mouth: no lesion  seen NECK: small multinodular goiter CHEST WALL: no deformity LUNGS: clear to auscultation CV: reg rate and rhythm, no murmur ABD: abdomen is soft, nontender.  no hepatosplenomegaly.  not distended.  no hernia MUSCULOSKELETAL: muscle bulk and strength are grossly normal.  no obvious joint swelling.  gait is normal and steady EXTEMITIES: no deformity.  Trace bilat leg edema PULSES: no carotid bruit NEURO:  cn 2-12 grossly intact.   readily moves all 4's.  sensation is intact to touch on all 4's.  No tremor SKIN:  Normal texture and temperature.  No rash or suspicious lesion is visible.  Not diaphoretic NODES:  None palpable at the neck PSYCH: alert, well-oriented.  Does not appear anxious nor depressed.  Lab Results  Component Value Date   TSH 3.06 10/07/2015   I have reviewed outside records, and summarized: Pt was noted to have goiter, and referred here.  I last saw this pt in 2015.  Thyroid was stable then      Assessment & Plan:  Multinodular goiter: due for recheck Dysphagia, new, uncertain etiology Hypothyroidism: recheck today.  Patient Instructions  blood tests are requested for you today.  We'll let you know about the results.  Let's recheck the ultrasound.  you will receive a phone call, about a day and time for an appointment.  Please see a specialist for the swallowing.  you will receive a phone call, about a day and time for an appointment.   Please come back for a follow-up appointment in 1 year.

## 2017-09-28 ENCOUNTER — Telehealth: Payer: Self-pay | Admitting: Endocrinology

## 2017-09-28 LAB — PTH, INTACT AND CALCIUM
Calcium: 9.9 mg/dL (ref 8.6–10.4)
PTH: 49 pg/mL (ref 14–64)

## 2017-09-28 LAB — VITAMIN D 25 HYDROXY (VIT D DEFICIENCY, FRACTURES): VITD: 47.2 ng/mL (ref 30.00–100.00)

## 2017-09-28 LAB — TSH: TSH: 3.23 u[IU]/mL (ref 0.35–4.50)

## 2017-09-28 NOTE — Telephone Encounter (Signed)
Patient is calling for the result of labs

## 2017-09-28 NOTE — Telephone Encounter (Signed)
I have spoken with patient & reviewed labs.

## 2017-10-03 ENCOUNTER — Encounter: Payer: Self-pay | Admitting: Gastroenterology

## 2017-10-16 ENCOUNTER — Ambulatory Visit
Admission: RE | Admit: 2017-10-16 | Discharge: 2017-10-16 | Disposition: A | Discharge status: Home / Self Care | Payer: PPO | Source: Ambulatory Visit | Attending: Endocrinology | Admitting: Endocrinology

## 2017-10-16 DIAGNOSIS — E042 Nontoxic multinodular goiter: Secondary | ICD-10-CM | POA: Diagnosis not present

## 2017-10-18 ENCOUNTER — Telehealth: Payer: Self-pay | Admitting: Endocrinology

## 2017-10-18 NOTE — Telephone Encounter (Signed)
Please call patient at ph# [phone] re: ultrasound results

## 2017-10-19 NOTE — Telephone Encounter (Signed)
LVM that [place] results had not changed & she could call back with any further questions.

## 2017-11-28 ENCOUNTER — Ambulatory Visit: Payer: Self-pay | Admitting: Gastroenterology

## 2018-02-07 ENCOUNTER — Other Ambulatory Visit (HOSPITAL_COMMUNITY): Payer: Self-pay | Admitting: Internal Medicine

## 2018-02-07 DIAGNOSIS — H538 Other visual disturbances: Secondary | ICD-10-CM

## 2018-02-07 DIAGNOSIS — I10 Essential (primary) hypertension: Secondary | ICD-10-CM | POA: Diagnosis not present

## 2018-02-07 DIAGNOSIS — R51 Headache: Secondary | ICD-10-CM | POA: Diagnosis not present

## 2018-02-07 DIAGNOSIS — Z6830 Body mass index (BMI) 30.0-30.9, adult: Secondary | ICD-10-CM | POA: Diagnosis not present

## 2018-02-07 DIAGNOSIS — Z23 Encounter for immunization: Secondary | ICD-10-CM | POA: Diagnosis not present

## 2018-02-08 ENCOUNTER — Ambulatory Visit (HOSPITAL_COMMUNITY)
Admission: RE | Admit: 2018-02-08 | Discharge: 2018-02-08 | Disposition: A | Discharge status: Home / Self Care | Payer: PPO | Source: Ambulatory Visit | Attending: Internal Medicine | Admitting: Internal Medicine

## 2018-02-08 DIAGNOSIS — H538 Other visual disturbances: Secondary | ICD-10-CM | POA: Diagnosis not present

## 2018-02-08 DIAGNOSIS — G319 Degenerative disease of nervous system, unspecified: Secondary | ICD-10-CM | POA: Diagnosis not present

## 2018-02-08 DIAGNOSIS — I739 Peripheral vascular disease, unspecified: Secondary | ICD-10-CM | POA: Insufficient documentation

## 2018-02-08 MED ORDER — GADOBUTROL 1 MMOL/ML IV SOLN
7.0000 mL | Freq: Once | INTRAVENOUS | Status: AC | PRN
  Administered 2018-02-08: 7 mL via INTRAVENOUS

## 2018-04-09 DIAGNOSIS — E039 Hypothyroidism, unspecified: Secondary | ICD-10-CM | POA: Diagnosis not present

## 2018-04-09 DIAGNOSIS — E785 Hyperlipidemia, unspecified: Secondary | ICD-10-CM | POA: Diagnosis not present

## 2018-04-09 DIAGNOSIS — E038 Other specified hypothyroidism: Secondary | ICD-10-CM | POA: Diagnosis not present

## 2018-04-09 DIAGNOSIS — R82998 Other abnormal findings in urine: Secondary | ICD-10-CM | POA: Diagnosis not present

## 2018-04-09 DIAGNOSIS — M859 Disorder of bone density and structure, unspecified: Secondary | ICD-10-CM | POA: Diagnosis not present

## 2018-04-09 DIAGNOSIS — E7849 Other hyperlipidemia: Secondary | ICD-10-CM | POA: Diagnosis not present

## 2018-04-09 DIAGNOSIS — M8580 Other specified disorders of bone density and structure, unspecified site: Secondary | ICD-10-CM | POA: Diagnosis not present

## 2018-04-09 DIAGNOSIS — I10 Essential (primary) hypertension: Secondary | ICD-10-CM | POA: Diagnosis not present

## 2018-04-16 DIAGNOSIS — E7849 Other hyperlipidemia: Secondary | ICD-10-CM | POA: Diagnosis not present

## 2018-04-16 DIAGNOSIS — Z6831 Body mass index (BMI) 31.0-31.9, adult: Secondary | ICD-10-CM | POA: Diagnosis not present

## 2018-04-16 DIAGNOSIS — N183 Chronic kidney disease, stage 3 (moderate): Secondary | ICD-10-CM | POA: Diagnosis not present

## 2018-04-16 DIAGNOSIS — C50919 Malignant neoplasm of unspecified site of unspecified female breast: Secondary | ICD-10-CM | POA: Diagnosis not present

## 2018-04-16 DIAGNOSIS — M859 Disorder of bone density and structure, unspecified: Secondary | ICD-10-CM | POA: Diagnosis not present

## 2018-04-16 DIAGNOSIS — I2510 Atherosclerotic heart disease of native coronary artery without angina pectoris: Secondary | ICD-10-CM | POA: Diagnosis not present

## 2018-04-16 DIAGNOSIS — E668 Other obesity: Secondary | ICD-10-CM | POA: Diagnosis not present

## 2018-04-16 DIAGNOSIS — H538 Other visual disturbances: Secondary | ICD-10-CM | POA: Diagnosis not present

## 2018-04-16 DIAGNOSIS — E038 Other specified hypothyroidism: Secondary | ICD-10-CM | POA: Diagnosis not present

## 2018-04-16 DIAGNOSIS — I10 Essential (primary) hypertension: Secondary | ICD-10-CM | POA: Diagnosis not present

## 2018-04-16 DIAGNOSIS — Z1389 Encounter for screening for other disorder: Secondary | ICD-10-CM | POA: Diagnosis not present

## 2018-04-16 DIAGNOSIS — Z1331 Encounter for screening for depression: Secondary | ICD-10-CM | POA: Diagnosis not present

## 2018-04-16 DIAGNOSIS — Z0000 Encounter for general adult medical examination without abnormal findings: Secondary | ICD-10-CM | POA: Diagnosis not present

## 2018-07-31 DIAGNOSIS — M25532 Pain in left wrist: Secondary | ICD-10-CM | POA: Diagnosis not present

## 2018-08-13 DIAGNOSIS — M25532 Pain in left wrist: Secondary | ICD-10-CM | POA: Diagnosis not present

## 2018-08-17 DIAGNOSIS — M25532 Pain in left wrist: Secondary | ICD-10-CM | POA: Diagnosis not present

## 2018-08-23 DIAGNOSIS — S66812A Strain of other specified muscles, fascia and tendons at wrist and hand level, left hand, initial encounter: Secondary | ICD-10-CM | POA: Diagnosis not present

## 2018-10-18 DIAGNOSIS — F329 Major depressive disorder, single episode, unspecified: Secondary | ICD-10-CM | POA: Diagnosis not present

## 2018-10-18 DIAGNOSIS — I2510 Atherosclerotic heart disease of native coronary artery without angina pectoris: Secondary | ICD-10-CM | POA: Diagnosis not present

## 2018-10-18 DIAGNOSIS — I129 Hypertensive chronic kidney disease with stage 1 through stage 4 chronic kidney disease, or unspecified chronic kidney disease: Secondary | ICD-10-CM | POA: Diagnosis not present

## 2018-10-18 DIAGNOSIS — E039 Hypothyroidism, unspecified: Secondary | ICD-10-CM | POA: Diagnosis not present

## 2018-10-18 DIAGNOSIS — N183 Chronic kidney disease, stage 3 (moderate): Secondary | ICD-10-CM | POA: Diagnosis not present

## 2018-12-27 DIAGNOSIS — Z23 Encounter for immunization: Secondary | ICD-10-CM | POA: Diagnosis not present

## 2019-12-25 IMAGING — MR MR HEAD WO/W CM
10 of 12 series · 35 of 48 positions shown · IV contrast (gadavist)
Comparison: None.

CLINICAL DATA: Blurred vision.  Difficulty walking.  Confusion.

EXAM:
MRI HEAD WITHOUT AND WITH CONTRAST
TECHNIQUE: Multiplanar, multiecho pulse sequences of the brain and surrounding
structures were obtained without and with intravenous contrast.
CONTRAST:  Gadavist 7 mL.

[Series 3: DWI · axial · 3.0mm · 1.09mm/px · z∈[-33,+111]mm · 10 of 98 slices shown (1 of 4)]
[im 1/98]
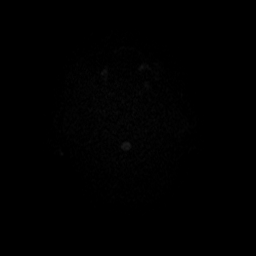
[im 11/98]
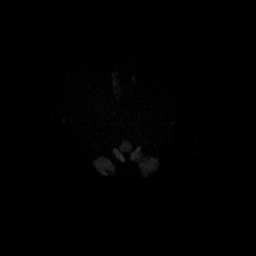
[im 22/98]
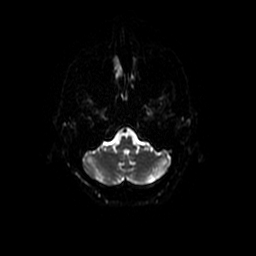
[im 33/98]
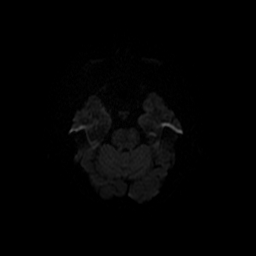
[im 44/98]
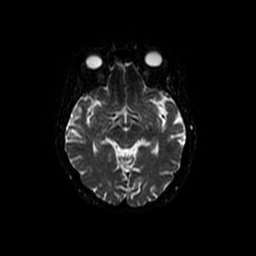
[im 54/98]
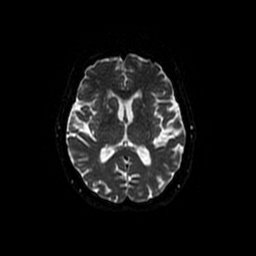
[im 65/98]
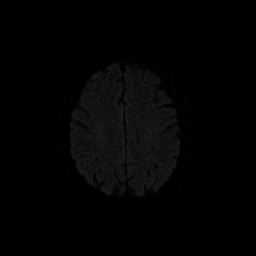
[im 76/98]
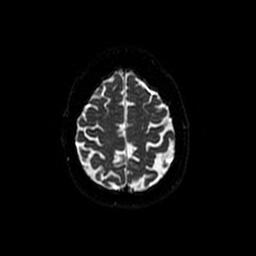
[im 87/98]
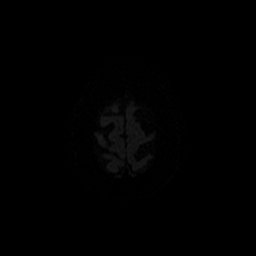
[im 98/98]
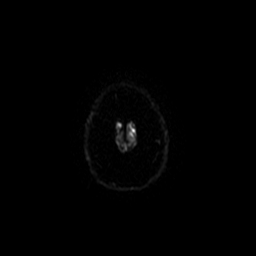

[Series 4: DWI · coronal · 5.0mm · 1.09mm/px · 6 of 68 slices shown (2 of 4)]
[im 1/68]
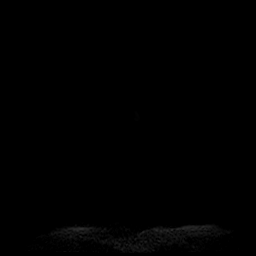
[im 14/68]
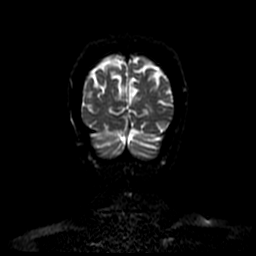
[im 27/68]
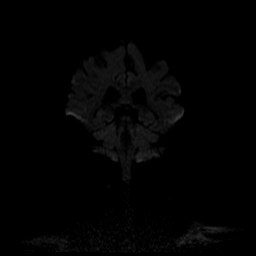
[im 41/68]
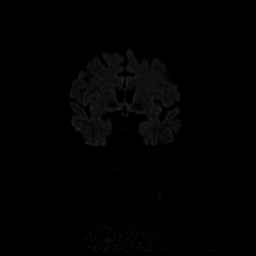
[im 54/68]
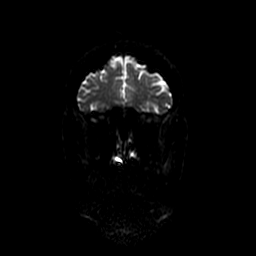
[im 68/68]
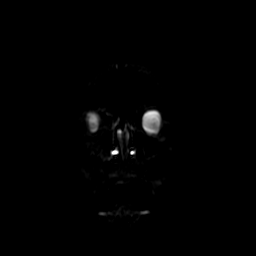

[Series 5: T1 · sagittal · 5.0mm · 0.47mm/px · 2 of 25 slices shown]
[im 1/25]
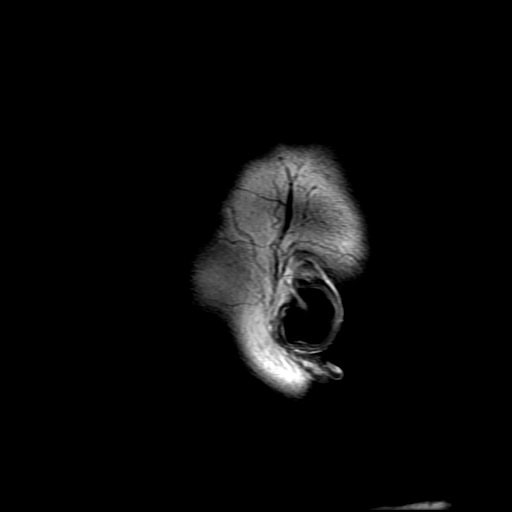
[im 25/25]
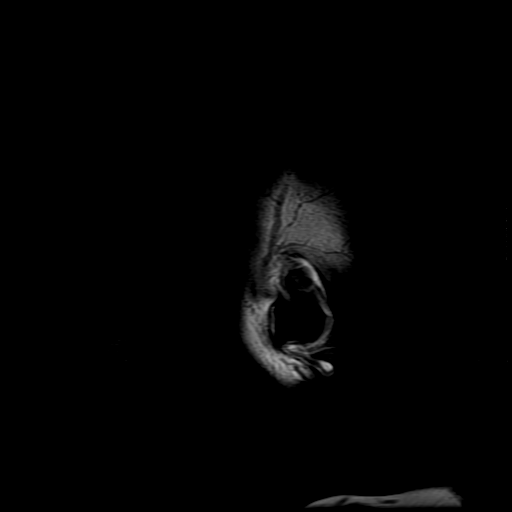

[Series 6: T2 · axial · 5.0mm · 0.43mm/px · z∈[-33,+111]mm · 2 of 25 slices shown]
[im 1/25]
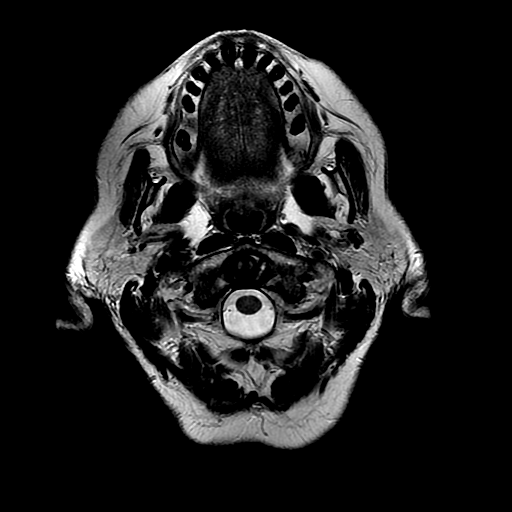
[im 25/25]
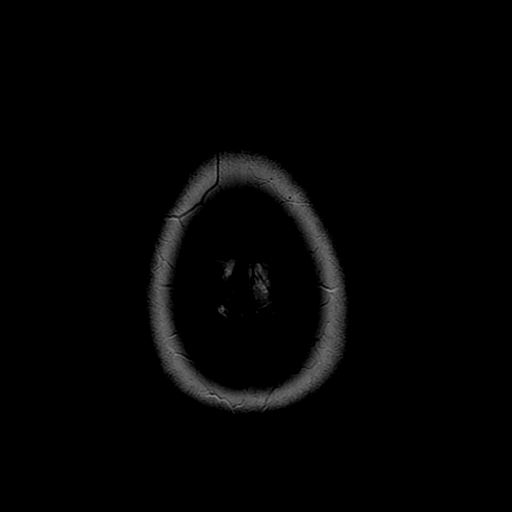

[Series 7: FLAIR · axial · 3.0mm · 0.43mm/px · z∈[-33,+111]mm · 2 of 25 slices shown]
[im 1/25]
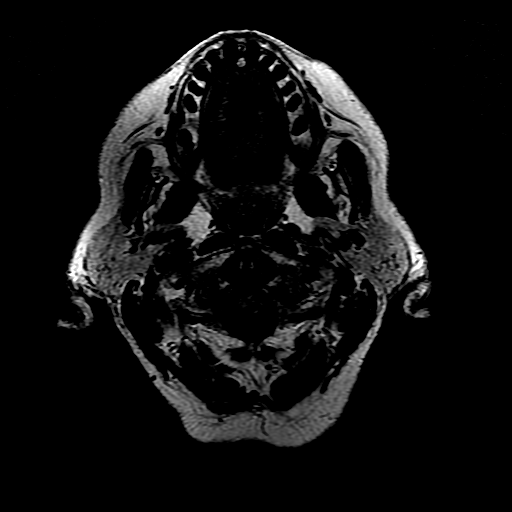
[im 25/25]
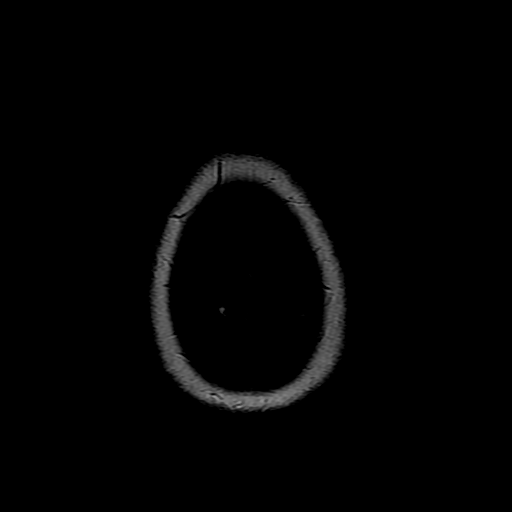

[Series 8: ax mpgr · axial · 5.0mm · 0.43mm/px · z∈[-33,+111]mm · 2 of 25 slices shown]
[im 1/25]
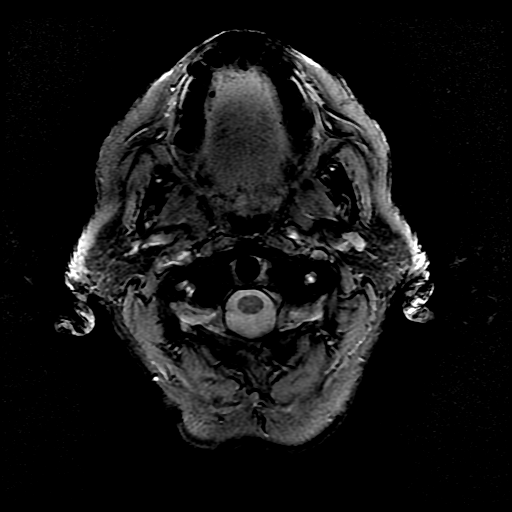
[im 25/25]
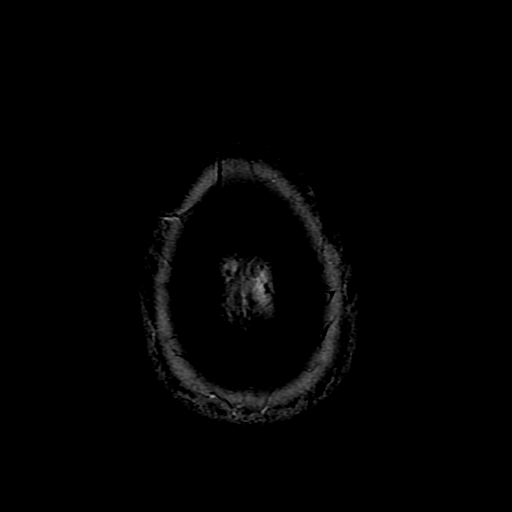

[Series 10: T2 post-contrast · coronal · 5.0mm · 0.39mm/px · 2 of 27 slices shown]
[im 1/27]
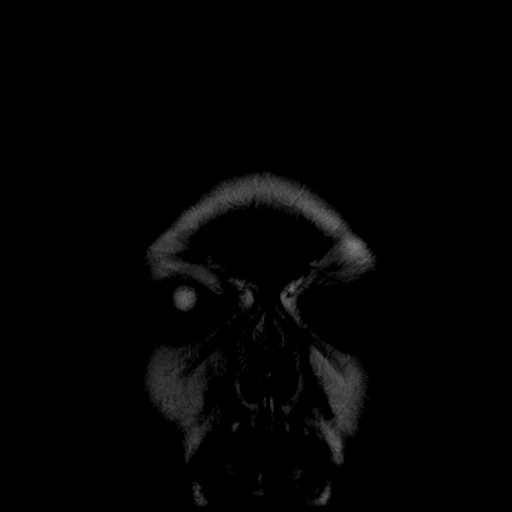
[im 27/27]
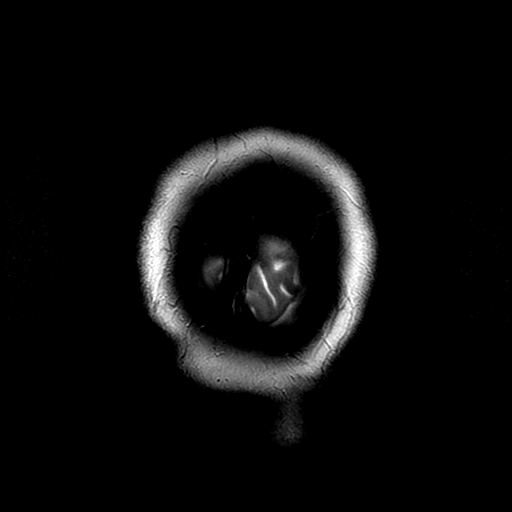

[Series 12: T1 post-contrast · coronal · 5.0mm · 0.39mm/px · 2 of 27 slices shown]
[im 1/27]
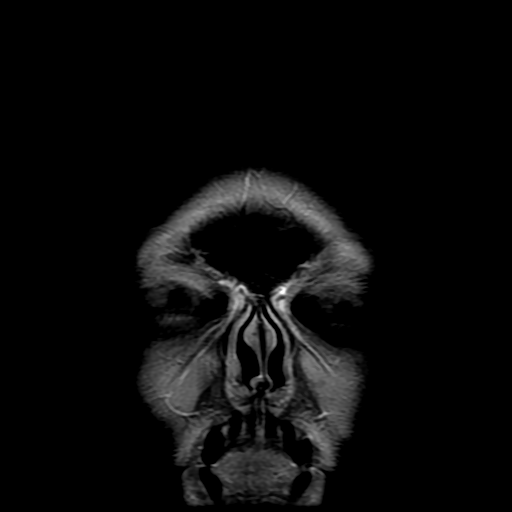
[im 27/27]
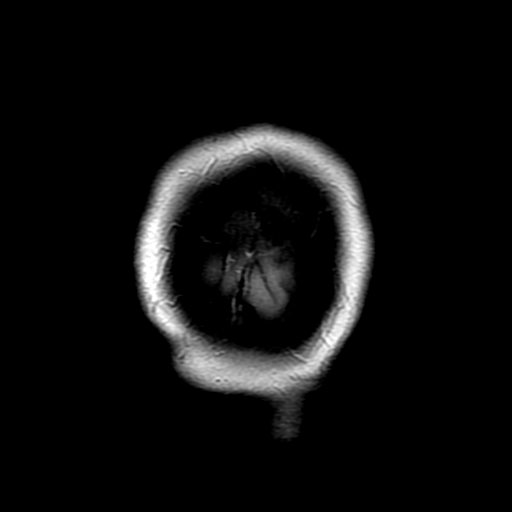

[Series 300: DWI · axial · 3.0mm · 1.09mm/px · z∈[-33,+111]mm · 4 of 49 slices shown (3 of 4)]
[im 1/49]
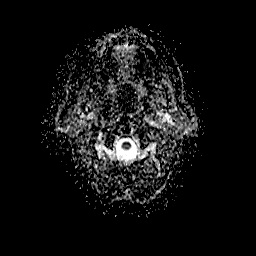
[im 17/49]
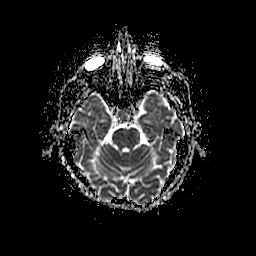
[im 33/49]
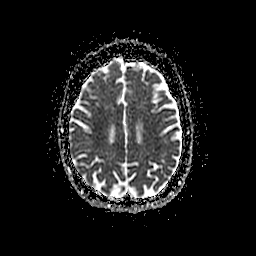
[im 49/49]
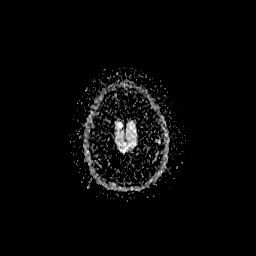

[Series 400: DWI · coronal · 5.0mm · 1.09mm/px · 3 of 34 slices shown (4 of 4)]
[im 1/34]
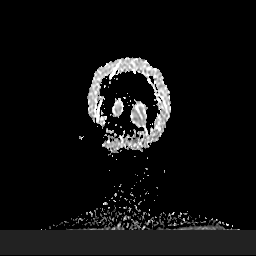
[im 17/34]
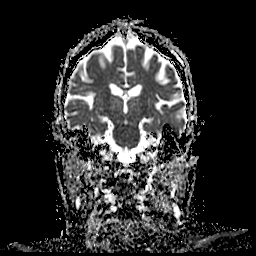
[im 34/34]
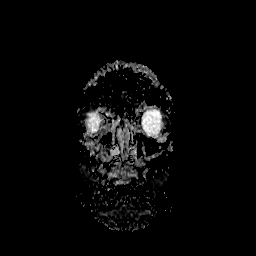

[35 of 48 positions shown; findings below may reference images not displayed]

FINDINGS: Brain: No evidence for acute infarction, hemorrhage, mass lesion,
hydrocephalus, or extra-axial fluid. Mild cerebral and cerebellar
atrophy. Moderately advanced T2 and FLAIR hyperintensities in the
white matter, likely small vessel disease. Scattered areas of
lacunar infarction, RIGHT basal ganglia.

Post infusion, no abnormal enhancement of the brain or meninges.

Vascular: Normal flow voids.

Skull and upper cervical spine: Normal marrow signal.

Sinuses/Orbits: Negative.  BILATERAL cataract extraction.

Other: None.
IMPRESSION: Mild atrophy with moderately advanced small vessel disease. No acute
intracranial findings. No abnormal postcontrast enhancement.

## 2020-07-16 DIAGNOSIS — E039 Hypothyroidism, unspecified: Secondary | ICD-10-CM | POA: Diagnosis not present

## 2020-07-16 DIAGNOSIS — E785 Hyperlipidemia, unspecified: Secondary | ICD-10-CM | POA: Diagnosis not present

## 2020-07-16 DIAGNOSIS — M859 Disorder of bone density and structure, unspecified: Secondary | ICD-10-CM | POA: Diagnosis not present

## 2020-07-23 DIAGNOSIS — I129 Hypertensive chronic kidney disease with stage 1 through stage 4 chronic kidney disease, or unspecified chronic kidney disease: Secondary | ICD-10-CM | POA: Diagnosis not present

## 2020-07-23 DIAGNOSIS — R82998 Other abnormal findings in urine: Secondary | ICD-10-CM | POA: Diagnosis not present

## 2020-08-27 DIAGNOSIS — Z853 Personal history of malignant neoplasm of breast: Secondary | ICD-10-CM | POA: Diagnosis not present

## 2020-08-27 DIAGNOSIS — N1832 Chronic kidney disease, stage 3b: Secondary | ICD-10-CM | POA: Diagnosis not present

## 2020-08-27 DIAGNOSIS — R7989 Other specified abnormal findings of blood chemistry: Secondary | ICD-10-CM | POA: Diagnosis not present

## 2020-08-27 DIAGNOSIS — E039 Hypothyroidism, unspecified: Secondary | ICD-10-CM | POA: Diagnosis not present

## 2020-08-27 DIAGNOSIS — I2510 Atherosclerotic heart disease of native coronary artery without angina pectoris: Secondary | ICD-10-CM | POA: Diagnosis not present

## 2020-08-27 DIAGNOSIS — Z0000 Encounter for general adult medical examination without abnormal findings: Secondary | ICD-10-CM | POA: Diagnosis not present

## 2020-08-27 DIAGNOSIS — E785 Hyperlipidemia, unspecified: Secondary | ICD-10-CM | POA: Diagnosis not present

## 2020-08-27 DIAGNOSIS — E669 Obesity, unspecified: Secondary | ICD-10-CM | POA: Diagnosis not present

## 2020-08-27 DIAGNOSIS — I129 Hypertensive chronic kidney disease with stage 1 through stage 4 chronic kidney disease, or unspecified chronic kidney disease: Secondary | ICD-10-CM | POA: Diagnosis not present

## 2020-09-11 ENCOUNTER — Other Ambulatory Visit: Payer: Self-pay | Admitting: Internal Medicine

## 2020-09-11 DIAGNOSIS — Z1231 Encounter for screening mammogram for malignant neoplasm of breast: Secondary | ICD-10-CM

## 2021-03-18 ENCOUNTER — Other Ambulatory Visit: Payer: Self-pay

## 2021-03-18 ENCOUNTER — Emergency Department (HOSPITAL_COMMUNITY)
Admission: EM | Admit: 2021-03-18 | Discharge: 2021-03-18 | Disposition: A | Discharge status: Home / Self Care | Payer: PPO | Attending: Emergency Medicine | Admitting: Emergency Medicine

## 2021-03-18 ENCOUNTER — Encounter (HOSPITAL_COMMUNITY): Payer: Self-pay | Admitting: Pharmacy Technician

## 2021-03-18 DIAGNOSIS — Z7902 Long term (current) use of antithrombotics/antiplatelets: Secondary | ICD-10-CM | POA: Diagnosis not present

## 2021-03-18 DIAGNOSIS — Y92410 Unspecified street and highway as the place of occurrence of the external cause: Secondary | ICD-10-CM | POA: Insufficient documentation

## 2021-03-18 DIAGNOSIS — I10 Essential (primary) hypertension: Secondary | ICD-10-CM | POA: Diagnosis not present

## 2021-03-18 DIAGNOSIS — R1031 Right lower quadrant pain: Secondary | ICD-10-CM | POA: Diagnosis not present

## 2021-03-18 DIAGNOSIS — Z5321 Procedure and treatment not carried out due to patient leaving prior to being seen by health care provider: Secondary | ICD-10-CM | POA: Diagnosis not present

## 2021-03-18 DIAGNOSIS — R1084 Generalized abdominal pain: Secondary | ICD-10-CM | POA: Diagnosis not present

## 2021-03-18 DIAGNOSIS — M542 Cervicalgia: Secondary | ICD-10-CM | POA: Diagnosis not present

## 2021-03-18 DIAGNOSIS — R1032 Left lower quadrant pain: Secondary | ICD-10-CM | POA: Diagnosis not present

## 2021-03-18 NOTE — ED Notes (Addendum)
Patient stated she wanted to go home and did not want to be seen due to her dog being at home alone for a prolonged period. "This is a waste of my time and yours" Pt encouraged to stay. Both PA and RN are aware of patient's wishes. Pt returned to the lobby.

## 2021-03-18 NOTE — ED Notes (Signed)
Patient decided to leave didn't want to wait stated that she was fine
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
## 2021-03-24 DIAGNOSIS — S42295A Other nondisplaced fracture of upper end of left humerus, initial encounter for closed fracture: Secondary | ICD-10-CM | POA: Diagnosis not present

## 2021-03-24 DIAGNOSIS — S4992XA Unspecified injury of left shoulder and upper arm, initial encounter: Secondary | ICD-10-CM | POA: Diagnosis not present

## 2021-03-24 DIAGNOSIS — S42292A Other displaced fracture of upper end of left humerus, initial encounter for closed fracture: Secondary | ICD-10-CM | POA: Diagnosis not present

## 2021-03-24 DIAGNOSIS — S42252A Displaced fracture of greater tuberosity of left humerus, initial encounter for closed fracture: Secondary | ICD-10-CM | POA: Diagnosis not present

## 2021-03-31 DIAGNOSIS — S42232A 3-part fracture of surgical neck of left humerus, initial encounter for closed fracture: Secondary | ICD-10-CM | POA: Diagnosis not present

## 2021-04-21 DIAGNOSIS — M25511 Pain in right shoulder: Secondary | ICD-10-CM | POA: Diagnosis not present

## 2021-06-28 DIAGNOSIS — C50912 Malignant neoplasm of unspecified site of left female breast: Secondary | ICD-10-CM | POA: Diagnosis not present

## 2024-02-03 DEATH — deceased
